# Patient Record
Sex: Female | Born: 1980 | Race: White | Hispanic: No | Marital: Married | Smoking: Former smoker
Health system: Southern US, Community
[De-identification: ages and names within clinical notes are randomized; demographics above are authoritative.]

## PROBLEM LIST (undated history)

## (undated) DIAGNOSIS — F419 Anxiety disorder, unspecified: Secondary | ICD-10-CM

## (undated) DIAGNOSIS — F32A Depression, unspecified: Secondary | ICD-10-CM

## (undated) DIAGNOSIS — O9989 Other specified diseases and conditions complicating pregnancy, childbirth and the puerperium: Secondary | ICD-10-CM

## (undated) DIAGNOSIS — R51 Headache: Secondary | ICD-10-CM

## (undated) DIAGNOSIS — K219 Gastro-esophageal reflux disease without esophagitis: Secondary | ICD-10-CM

## (undated) DIAGNOSIS — T7840XA Allergy, unspecified, initial encounter: Secondary | ICD-10-CM

## (undated) DIAGNOSIS — R519 Headache, unspecified: Secondary | ICD-10-CM

## (undated) DIAGNOSIS — Z8659 Personal history of other mental and behavioral disorders: Secondary | ICD-10-CM

## (undated) DIAGNOSIS — Z8669 Personal history of other diseases of the nervous system and sense organs: Secondary | ICD-10-CM

## (undated) DIAGNOSIS — Z8619 Personal history of other infectious and parasitic diseases: Secondary | ICD-10-CM

## (undated) DIAGNOSIS — R112 Nausea with vomiting, unspecified: Secondary | ICD-10-CM

## (undated) DIAGNOSIS — Z9889 Other specified postprocedural states: Secondary | ICD-10-CM

## (undated) DIAGNOSIS — F329 Major depressive disorder, single episode, unspecified: Secondary | ICD-10-CM

## (undated) DIAGNOSIS — O99891 Other specified diseases and conditions complicating pregnancy: Secondary | ICD-10-CM

## (undated) DIAGNOSIS — D649 Anemia, unspecified: Secondary | ICD-10-CM

## (undated) HISTORY — DX: Personal history of other infectious and parasitic diseases: Z86.19

## (undated) HISTORY — DX: Personal history of other diseases of the nervous system and sense organs: Z86.69

## (undated) HISTORY — DX: Other specified diseases and conditions complicating pregnancy: O99.891

## (undated) HISTORY — DX: Other specified diseases and conditions complicating pregnancy, childbirth and the puerperium: O99.89

## (undated) HISTORY — DX: Personal history of other mental and behavioral disorders: Z86.59

## (undated) HISTORY — DX: Allergy, unspecified, initial encounter: T78.40XA

## (undated) HISTORY — DX: Anemia, unspecified: D64.9

## (undated) HISTORY — PX: WISDOM TOOTH EXTRACTION: SHX21

## (undated) HISTORY — DX: Gastro-esophageal reflux disease without esophagitis: K21.9

---
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
## 2006-10-20 ENCOUNTER — Emergency Department (HOSPITAL_COMMUNITY): Admission: EM | Admit: 2006-10-20 | Discharge: 2006-10-20 | Payer: Self-pay | Admitting: Emergency Medicine

## 2007-03-19 ENCOUNTER — Emergency Department (HOSPITAL_COMMUNITY): Admission: EM | Admit: 2007-03-19 | Discharge: 2007-03-19 | Payer: Self-pay | Admitting: Emergency Medicine

## 2008-09-19 IMAGING — CT CT HEAD W/O CM
1 of 2 series · 13 of 30 positions shown, 17 images · non-contrast
Comparison: None.

CLINICAL DATA: Migraine headache.

HEAD CT WITHOUT CONTRAST
TECHNIQUE: 5mm collimated images were obtained from the base of the skull
through the vertex, according to standard protocol, without contrast.

[Series 2: brain · axial · 0.47mm/px · z∈[+149,+269]mm · 13 of 28 slices shown, 17 images]
[im 2/28  brain]
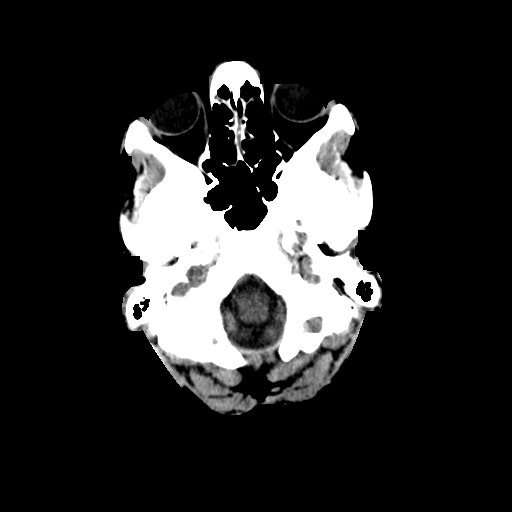
[im 2/28  bone]
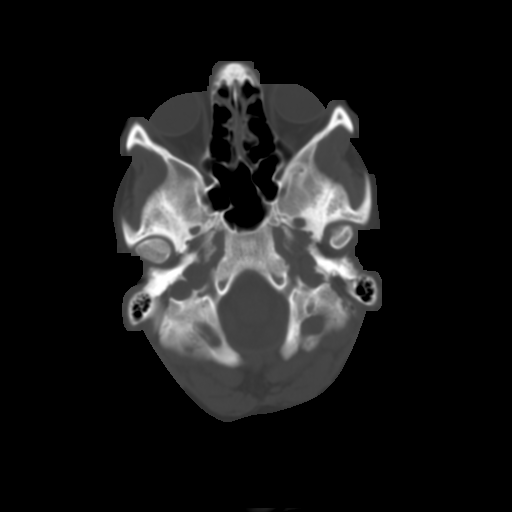
[im 4/28  brain]
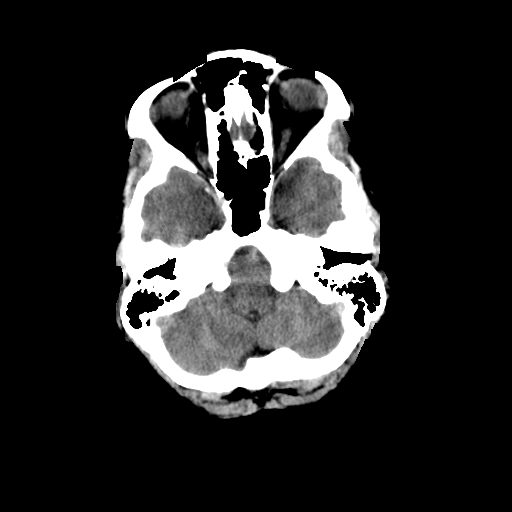
[im 6/28  brain]
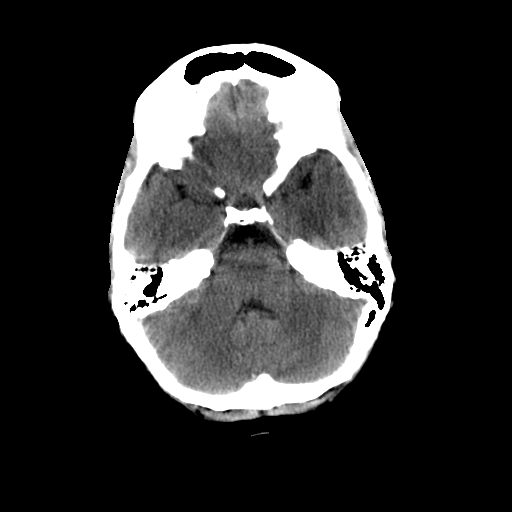
[im 8/28  brain]
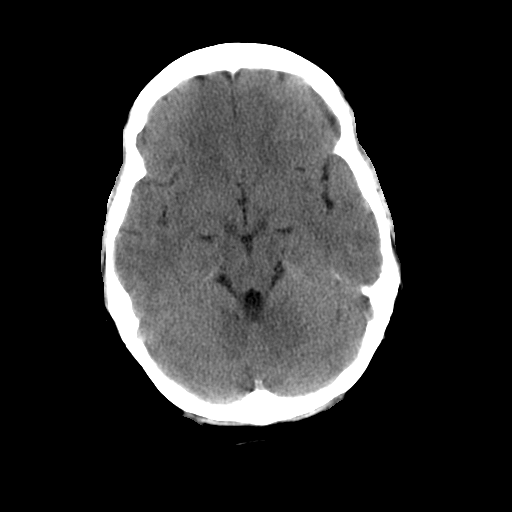
[im 10/28  brain]
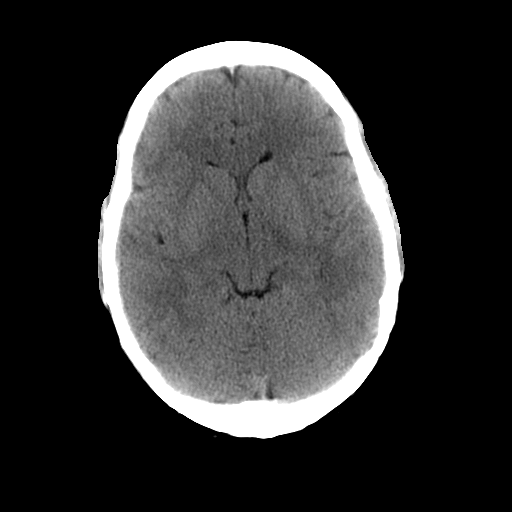
[im 10/28  bone]
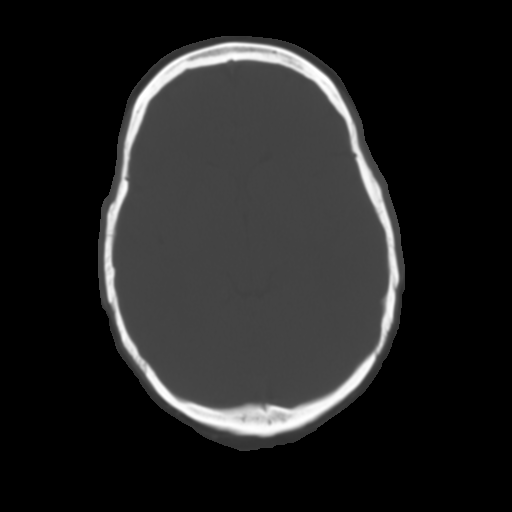
[im 12/28  brain]
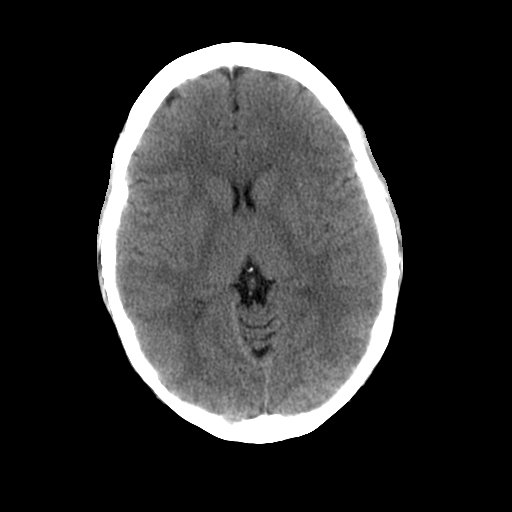
[im 14/28  brain]
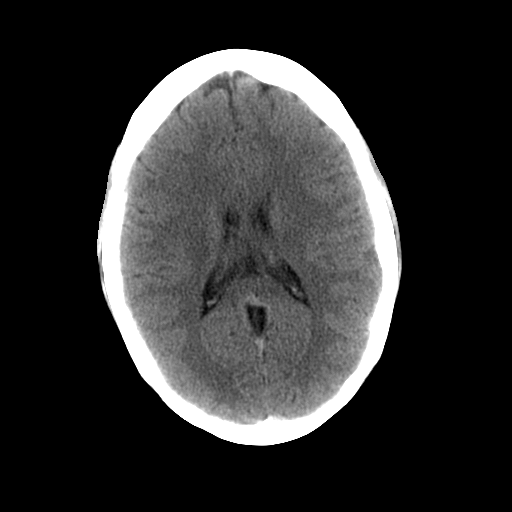
[im 16/28  brain]
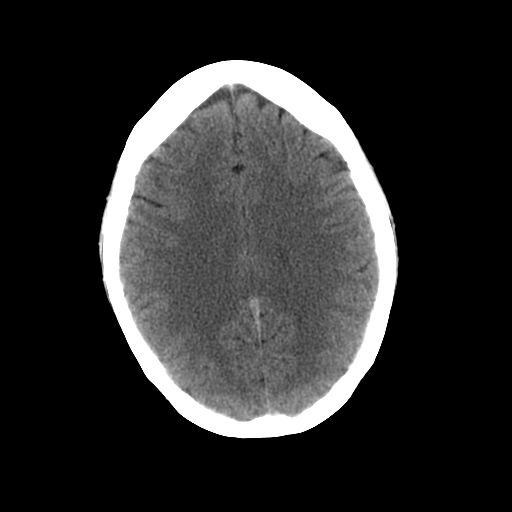
[im 18/28  brain]
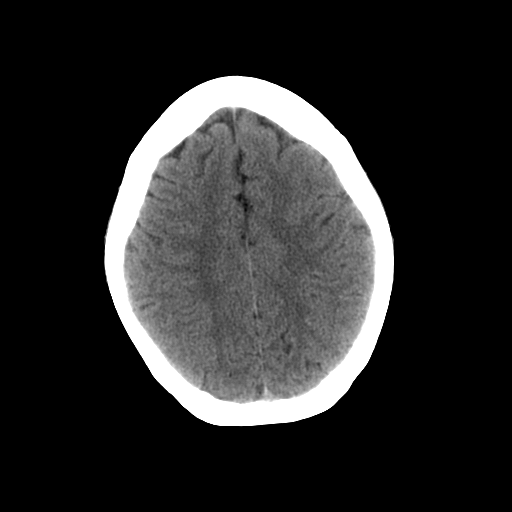
[im 18/28  bone]
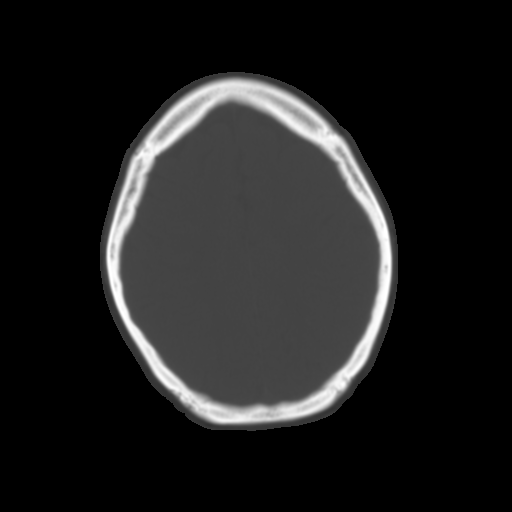
[im 20/28  brain]
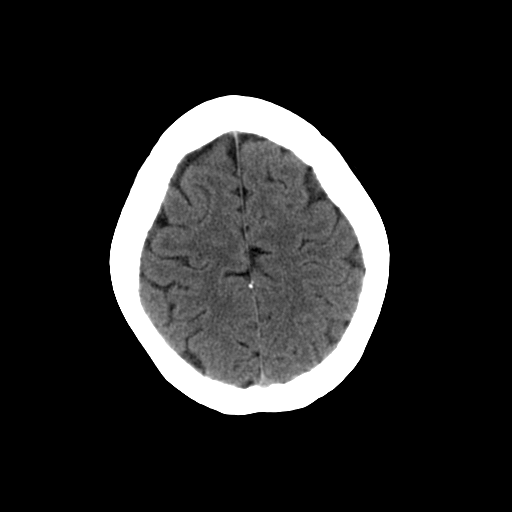
[im 22/28  brain]
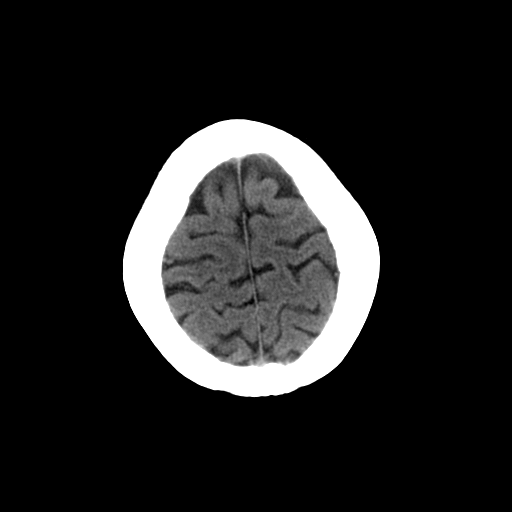
[im 24/28  brain]
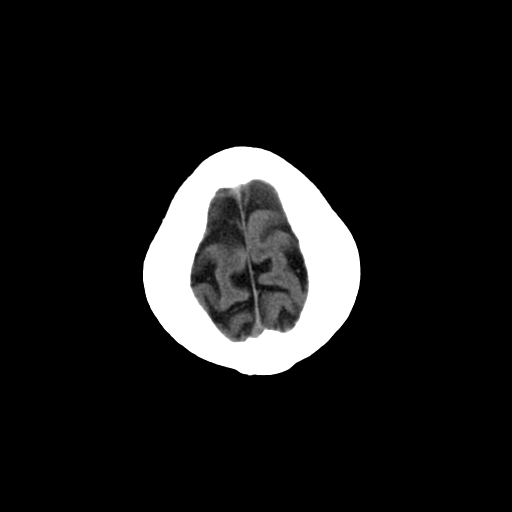
[im 26/28  brain]
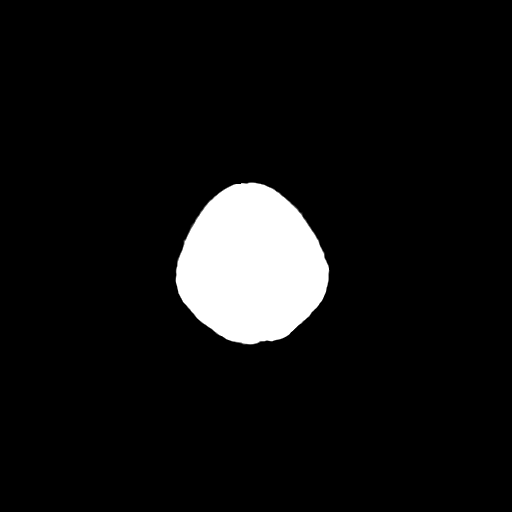
[im 26/28  bone]
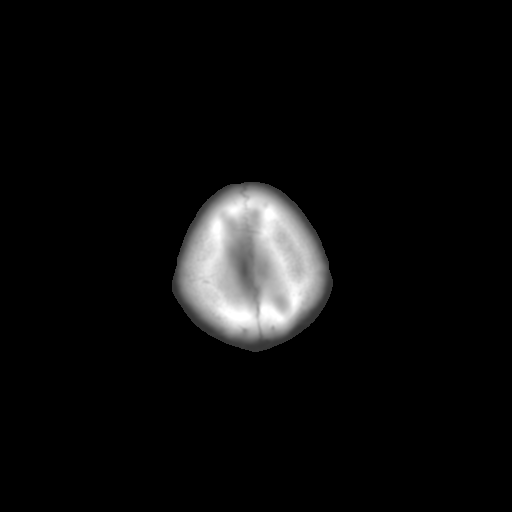

[13 of 30 positions shown; findings below may reference images not displayed]

FINDINGS: Normal appearing cerebral hemispheres and posterior fossa structures.
Normal size and position of the ventricles.  No intracranial hemorrhage, mass,
mass effect or areas of acute infarction identified.  Unremarkable bones and
included portions of the paranasal sinuses.

IMPRESSION

Normal examination.

## 2011-11-03 ENCOUNTER — Encounter (INDEPENDENT_AMBULATORY_CARE_PROVIDER_SITE_OTHER): Payer: Medicaid Other | Admitting: Obstetrics and Gynecology

## 2011-11-03 ENCOUNTER — Other Ambulatory Visit: Payer: Medicaid Other

## 2011-11-03 DIAGNOSIS — Z331 Pregnant state, incidental: Secondary | ICD-10-CM

## 2011-11-17 ENCOUNTER — Encounter: Payer: Medicaid Other | Admitting: Obstetrics and Gynecology

## 2011-11-18 ENCOUNTER — Encounter: Payer: Medicaid Other | Admitting: Obstetrics and Gynecology

## 2011-11-18 ENCOUNTER — Encounter (INDEPENDENT_AMBULATORY_CARE_PROVIDER_SITE_OTHER): Payer: Medicaid Other

## 2011-11-18 DIAGNOSIS — Z3480 Encounter for supervision of other normal pregnancy, unspecified trimester: Secondary | ICD-10-CM

## 2011-11-18 DIAGNOSIS — O358XX0 Maternal care for other (suspected) fetal abnormality and damage, not applicable or unspecified: Secondary | ICD-10-CM

## 2011-12-02 ENCOUNTER — Encounter: Payer: Self-pay | Admitting: Obstetrics and Gynecology

## 2011-12-02 ENCOUNTER — Ambulatory Visit (INDEPENDENT_AMBULATORY_CARE_PROVIDER_SITE_OTHER): Payer: Medicaid Other | Admitting: Obstetrics and Gynecology

## 2011-12-02 VITALS — BP 102/66 | Wt 174.0 lb

## 2011-12-02 DIAGNOSIS — Z8659 Personal history of other mental and behavioral disorders: Secondary | ICD-10-CM

## 2011-12-02 DIAGNOSIS — Z9889 Other specified postprocedural states: Secondary | ICD-10-CM

## 2011-12-02 DIAGNOSIS — F489 Nonpsychotic mental disorder, unspecified: Secondary | ICD-10-CM

## 2011-12-02 DIAGNOSIS — F329 Major depressive disorder, single episode, unspecified: Secondary | ICD-10-CM

## 2011-12-02 DIAGNOSIS — R51 Headache: Secondary | ICD-10-CM

## 2011-12-02 DIAGNOSIS — O9989 Other specified diseases and conditions complicating pregnancy, childbirth and the puerperium: Secondary | ICD-10-CM

## 2011-12-02 DIAGNOSIS — O99340 Other mental disorders complicating pregnancy, unspecified trimester: Secondary | ICD-10-CM

## 2011-12-02 DIAGNOSIS — O26899 Other specified pregnancy related conditions, unspecified trimester: Secondary | ICD-10-CM

## 2011-12-02 DIAGNOSIS — O09299 Supervision of pregnancy with other poor reproductive or obstetric history, unspecified trimester: Secondary | ICD-10-CM

## 2011-12-02 DIAGNOSIS — G43009 Migraine without aura, not intractable, without status migrainosus: Secondary | ICD-10-CM

## 2011-12-02 DIAGNOSIS — Z98891 History of uterine scar from previous surgery: Secondary | ICD-10-CM

## 2011-12-02 MED ORDER — CYCLOBENZAPRINE HCL 5 MG PO TABS: ORAL_TABLET | ORAL | Status: DC

## 2011-12-02 MED ORDER — SERTRALINE HCL 50 MG PO TABS: 50.0000 mg | ORAL_TABLET | Freq: Every day | ORAL | Status: DC

## 2011-12-02 NOTE — Progress Notes (Signed)
C/O HA'S AND VISUAL CHANGES X 1 MONTH. NO DIZZINESS. H/O MIGRAINES. DISCUSS ISSUES AT HOME

## 2011-12-02 NOTE — Patient Instructions (Addendum)
Depression  Depression is a strong emotion of feeling unhappy that can last for weeks, months, or even longer. Depression causes problems with the ability to function in life. It upsets your:   Relationships.   Sleep.   Eating habits.   Work habits.  HOME CARE  Take all medicine as told by your doctor.   Talk with a therapist, counselor, or friend.   Eat a healthy diet.   Exercise regularly.   Do not drink alcohol or use drugs.  GET HELP RIGHT AWAY IF: You start to have thoughts about hurting yourself or others. MAKE SURE YOU:  Understand these instructions.   Will watch your condition.   Will get help right away if you are not doing well or get worse.  Document Released: 09/13/2010 Document Revised: 07/31/2011 Document Reviewed: 09/13/2010 [facility] Patient Information 2012 [place], [place].Headache and Allergies The relationship between allergies and headaches is unclear. Many people with allergic or infectious nasal problems also have headaches (migraines or sinus headaches). However, sometimes allergies can cause pressure that feels like a headache, and sometimes headaches can cause allergy-like symptoms. It is not always clear whether your symptoms are caused by allergies or by a headache. CAUSES   Migraine: The cause of a migraine is not always known.   Sinus Headache: The cause of a sinus headache may be a sinus infection. Other conditions that may be related to sinus headaches include:   Hay fever (allergic rhinitis).   Deviation of the nasal septum.   Swelling or clogging of the nasal passages.  SYMPTOMS  Migraine headache symptoms (which often last 4 to 72 hours) include:  Intense, throbbing pain on one or both sides of the head.   Nausea.   Vomiting.   Being extra sensitive to light.   Being extra sensitive to sound.   Nervous system reactions that appear similar to an allergic reaction:   Stuffy nose.   Runny nose.   Tearing.  Sinus headaches  are felt as facial pain or pressure.  DIAGNOSIS  Because there is some overlap in symptoms, sinus and migraine headaches are often misdiagnosed. For example, a person with migraines may also feel facial pressure. Likewise, many people with hay fever may get migraine headaches rather than sinus headaches. These migraines can be triggered by the histamine release during an allergic reaction. An antihistamine medicine can eliminate this pain. There are standard criteria that help clarify the difference between these headaches and related allergy or allergy-like symptoms. Your caregiver can use these criteria to determine the proper diagnosis and provide you the best care. TREATMENT  Migraine medicine may help people who have persistent migraine headaches even though their hay fever is controlled. For some people, anti-inflammatory treatments do not work to relieve migraines. Medicines called triptans (such as sumatriptan) can be helpful for those people. Document Released: 11/01/2003 Document Revised: 07/31/2011 Document Reviewed: 11/23/2009 [facility] Patient Information 2012 [place], [place].

## 2011-12-03 DIAGNOSIS — G43009 Migraine without aura, not intractable, without status migrainosus: Secondary | ICD-10-CM | POA: Insufficient documentation

## 2011-12-03 DIAGNOSIS — Z8659 Personal history of other mental and behavioral disorders: Secondary | ICD-10-CM | POA: Insufficient documentation

## 2011-12-03 NOTE — Progress Notes (Signed)
Subjective:    Miranda Hall is a 31 y.o. female being seen today for her obstetrical visit. She is at [redacted]w[redacted]d gestation. Patient reports headache and stressed mood and hx post partum depression. Fetal movement: normal.  Review of Systems:   Review of Systems  Constitutional: Positive for activity change.  HENT: Positive for neck stiffness. Negative for ear pain and neck pain.   Eyes: Positive for photophobia. Negative for pain, discharge, redness and itching.       Photophobia with headaches which right now occur daily  Respiratory: Negative.   Cardiovascular: Negative.   Gastrointestinal: Negative.   Neurological: Positive for numbness and headaches. Negative for dizziness, tremors, seizures, syncope, facial asymmetry, speech difficulty, weakness and light-headedness.  Hematological: Negative.   Psychiatric/Behavioral: Positive for dysphoric mood and decreased concentration. Negative for hallucinations, behavioral problems, confusion and agitation.       Specifically denied suicidal or homicidal ideation     Objective:    BP 102/66  Wt 174 lb (78.926 kg) Normal 3 hr GTT  Physical Exam  Constitutional: She is oriented to person, place, and time. She appears well-developed and well-nourished.  HENT:  Head: Normocephalic and atraumatic.  Neck: Normal range of motion. Neck supple.  Cardiovascular: Normal rate.   Respiratory: Effort normal.  GI: Soft.  Musculoskeletal: Normal range of motion.  Neurological: She is alert and oriented to person, place, and time.  Skin: Skin is warm and dry.  Psychiatric:       Saddened affect    Exam  FHT:  150's BPM  Uterine Size: 28 cm  Presentation: unsure     Assessment:    Pregnancy:  G2P1001 Depression Headaches:  Tension vs migraines Pt states she feels safe in her household    Plan:    Patient Active Problem List  Diagnoses  . H/O: cesarean section  . H/O postpartum depression, currently pregnant  . Migraine  headache without aura    Agrees to start Zoloft and to go to couselor Follow up in 2 Weeks.

## 2011-12-03 NOTE — Progress Notes (Signed)
Relates significant stressors at home.  Hx PPdepression last delivery.  Wants medication and counseling therapy

## 2011-12-17 ENCOUNTER — Encounter: Payer: Self-pay | Admitting: Obstetrics and Gynecology

## 2011-12-17 ENCOUNTER — Ambulatory Visit (INDEPENDENT_AMBULATORY_CARE_PROVIDER_SITE_OTHER): Payer: Medicaid Other | Admitting: Obstetrics and Gynecology

## 2011-12-17 VITALS — BP 100/64 | Ht 62.0 in | Wt 178.0 lb

## 2011-12-17 DIAGNOSIS — Z331 Pregnant state, incidental: Secondary | ICD-10-CM

## 2011-12-17 NOTE — Patient Instructions (Signed)
Preterm Labor Preterm labor is when labor starts at less than 37 weeks of pregnancy. The normal length of a pregnancy is 39 to 41 weeks. CAUSES Often, there is no identifiable underlying cause as to why a woman goes into preterm labor. However, one of the most common known causes of preterm labor is infection. Infections of the uterus, cervix, vagina, amniotic sac, bladder, kidney, or even the lungs (pneumonia) can cause labor to start. Other causes of preterm labor include:  Urogenital infections, such as yeast infections and bacterial vaginosis.   Uterine abnormalities (uterine shape, uterine septum, fibroids, bleeding from the placenta).   A cervix that has been operated on and opens prematurely.   Malformations in the baby.   Multiple gestations (twins, triplets, and so on).   Breakage of the amniotic sac.  Additional risk factors for preterm labor include:  Previous history of preterm labor.   Premature rupture of membranes (PROM).   A placenta that covers the opening of the cervix (placenta previa).   A placenta that separates from the uterus (placenta abruption).   A cervix that is too weak to hold the baby in the uterus (incompetence cervix).   Having too much fluid in the amniotic sac (polyhydramnios).   Taking illegal drugs or smoking while pregnant.   Not gaining enough weight while pregnant.   Women younger than 18 and older than 29 years old.   Low socioeconomic status.   African-American ethnicity.  SYMPTOMS Signs and symptoms of preterm labor include:  Menstrual-like cramps.   Contractions that are 30 to 70 seconds apart, become very regular, closer together, and are more intense and painful.   Contractions that start on the top of the uterus and spread down to the lower abdomen and back.   A sense of increased pelvic pressure or back pain.   A watery or bloody discharge that comes from the vagina.  DIAGNOSIS  A diagnosis can be confirmed by:  A  vaginal exam.   An ultrasound of the cervix.   Sampling (swabbing) cervico-vaginal secretions. These samples can be tested for the presence of fetal fibronectin. This is a protein found in cervical discharge which is associated with preterm labor.   Fetal monitoring.  TREATMENT  Depending on the length of the pregnancy and other circumstances, a caregiver may suggest bed rest. If necessary, there are medicines that can be given to stop contractions and to quicken fetal lung maturity. If labor happens before 34 weeks of pregnancy, a prolonged hospital stay may be recommended. Treatment depends on the condition of both the mother and baby. PREVENTION There are some things a mother can do to lower the risk of preterm labor in future pregnancies. A woman can:   Stop smoking.   Maintain healthy weight gain and avoid chemicals and drugs that are not necessary.   Be watchful for any type of infection.   Inform her caregiver if she has a known history of preterm labor.  Document Released: 11/01/2003 Document Revised: 07/31/2011 Document Reviewed: 12/06/2010 [employer] Patient Information 2012 [employer].Fetal Movement Counts Patient Name: __________________________________________________ Patient Due Date: ____________________ Kick counts is highly recommended in high risk pregnancies, but it is a good idea for every pregnant woman to do. Start counting fetal movements at 28 weeks of the pregnancy. Fetal movements increase after eating a full meal or eating or drinking something sweet (the blood sugar is higher). It is also important to drink plenty of fluids (well hydrated) before doing the count. Lie   on your left side because it helps with the circulation or you can sit in a comfortable chair with your arms over your belly (abdomen) with no distractions around you. DOING THE COUNT  Try to do the count the same time of day each time you do it.   Mark the day and time, then see how long it  takes for you to feel 10 movements (kicks, flutters, swishes, rolls). You should have at least 10 movements within 2 hours. You will most likely feel 10 movements in much less than 2 hours. If you do not, wait an hour and count again. After a couple of days you will see a pattern.   What you are looking for is a change in the pattern or not enough counts in 2 hours. Is it taking longer in time to reach 10 movements?  SEEK MEDICAL CARE IF:  You feel less than 10 counts in 2 hours. Tried twice.   No movement in one hour.   The pattern is changing or taking longer each day to reach 10 counts in 2 hours.   You feel the baby is not moving as it usually does.  Date: ____________ Movements: ____________ Start time: ____________ Finish time: ____________  Date: ____________ Movements: ____________ Start time: ____________ Finish time: ____________ Date: ____________ Movements: ____________ Start time: ____________ Finish time: ____________ Date: ____________ Movements: ____________ Start time: ____________ Finish time: ____________ Date: ____________ Movements: ____________ Start time: ____________ Finish time: ____________ Date: ____________ Movements: ____________ Start time: ____________ Finish time: ____________ Date: ____________ Movements: ____________ Start time: ____________ Finish time: ____________ Date: ____________ Movements: ____________ Start time: ____________ Finish time: ____________  Date: ____________ Movements: ____________ Start time: ____________ Finish time: ____________ Date: ____________ Movements: ____________ Start time: ____________ Finish time: ____________ Date: ____________ Movements: ____________ Start time: ____________ Finish time: ____________ Date: ____________ Movements: ____________ Start time: ____________ Finish time: ____________ Date: ____________ Movements: ____________ Start time: ____________ Finish time: ____________ Date: ____________ Movements:  ____________ Start time: ____________ Finish time: ____________ Date: ____________ Movements: ____________ Start time: ____________ Finish time: ____________  Date: ____________ Movements: ____________ Start time: ____________ Finish time: ____________ Date: ____________ Movements: ____________ Start time: ____________ Finish time: ____________ Date: ____________ Movements: ____________ Start time: ____________ Finish time: ____________ Date: ____________ Movements: ____________ Start time: ____________ Finish time: ____________ Date: ____________ Movements: ____________ Start time: ____________ Finish time: ____________ Date: ____________ Movements: ____________ Start time: ____________ Finish time: ____________ Date: ____________ Movements: ____________ Start time: ____________ Finish time: ____________  Date: ____________ Movements: ____________ Start time: ____________ Finish time: ____________ Date: ____________ Movements: ____________ Start time: ____________ Finish time: ____________ Date: ____________ Movements: ____________ Start time: ____________ Finish time: ____________ Date: ____________ Movements: ____________ Start time: ____________ Finish time: ____________ Date: ____________ Movements: ____________ Start time: ____________ Finish time: ____________ Date: ____________ Movements: ____________ Start time: ____________ Finish time: ____________ Date: ____________ Movements: ____________ Start time: ____________ Finish time: ____________  Date: ____________ Movements: ____________ Start time: ____________ Finish time: ____________ Date: ____________ Movements: ____________ Start time: ____________ Finish time: ____________ Date: ____________ Movements: ____________ Start time: ____________ Finish time: ____________ Date: ____________ Movements: ____________ Start time: ____________ Finish time: ____________ Date: ____________ Movements: ____________ Start time: ____________ Finish  time: ____________ Date: ____________ Movements: ____________ Start time: ____________ Finish time: ____________ Date: ____________ Movements: ____________ Start time: ____________ Finish time: ____________  Date: ____________ Movements: ____________ Start time: ____________ Finish time: ____________ Date: ____________ Movements: ____________ Start time: ____________ Finish time: ____________ Date: ____________ Movements: ____________ Start time: ____________ Finish time: ____________   Date: ____________ Movements: ____________ Start time: ____________ Finish time: ____________ Date: ____________ Movements: ____________ Start time: ____________ Finish time: ____________ Date: ____________ Movements: ____________ Start time: ____________ Finish time: ____________ Date: ____________ Movements: ____________ Start time: ____________ Finish time: ____________  Date: ____________ Movements: ____________ Start time: ____________ Finish time: ____________ Date: ____________ Movements: ____________ Start time: ____________ Finish time: ____________ Date: ____________ Movements: ____________ Start time: ____________ Finish time: ____________ Date: ____________ Movements: ____________ Start time: ____________ Finish time: ____________ Date: ____________ Movements: ____________ Start time: ____________ Finish time: ____________ Date: ____________ Movements: ____________ Start time: ____________ Finish time: ____________ Date: ____________ Movements: ____________ Start time: ____________ Finish time: ____________  Date: ____________ Movements: ____________ Start time: ____________ Finish time: ____________ Date: ____________ Movements: ____________ Start time: ____________ Finish time: ____________ Date: ____________ Movements: ____________ Start time: ____________ Finish time: ____________ Date: ____________ Movements: ____________ Start time: ____________ Finish time: ____________ Date: ____________  Movements: ____________ Start time: ____________ Finish time: ____________ Date: ____________ Movements: ____________ Start time: ____________ Finish time: ____________ Document Released: 09/10/2006 Document Revised: 07/31/2011 Document Reviewed: 03/13/2009 [employer] Patient Information 2012 [employer]. 

## 2011-12-17 NOTE — Progress Notes (Signed)
-   Desires VBAC

## 2011-12-24 ENCOUNTER — Inpatient Hospital Stay (HOSPITAL_COMMUNITY): Admission: AD | Admit: 2011-12-24 | Payer: Self-pay | Source: Ambulatory Visit | Admitting: Obstetrics and Gynecology

## 2011-12-27 ENCOUNTER — Telehealth: Payer: Self-pay | Admitting: Obstetrics and Gynecology

## 2011-12-27 NOTE — Telephone Encounter (Signed)
G2P1 at 35 weeks calling with pain to right of umbilicus and down into groin/leg crease, particularly with movement/position change. Positive FM, no contractions, leaking or bleeding.  No fever, HA, etc. No prenatal complications.  Probable round ligament pain. Comfort measures reviewed. Tylenol, heat, rest. Call if sx worsen or any other issues arise.

## 2011-12-30 ENCOUNTER — Other Ambulatory Visit: Payer: Self-pay | Admitting: Obstetrics and Gynecology

## 2011-12-30 DIAGNOSIS — O358XX0 Maternal care for other (suspected) fetal abnormality and damage, not applicable or unspecified: Secondary | ICD-10-CM

## 2011-12-30 NOTE — Progress Notes (Signed)
C/o L foot pain, tender to top of foot, no obvious swelling or bruising, declines any injury to her foot, states she is on her feet a lot, wearing flip/flops today Enc sneakers for better foot support, ice and keep elevated when can, if does not improve will refer to ortho Feels better on zoloft, hasn't been to counseling, enc pt to sched counsel  rv'd [facility] PTL sx's RTO 2wks

## 2011-12-31 ENCOUNTER — Ambulatory Visit (INDEPENDENT_AMBULATORY_CARE_PROVIDER_SITE_OTHER): Payer: Medicaid Other

## 2011-12-31 VITALS — BP 120/68 | Wt 183.0 lb

## 2011-12-31 DIAGNOSIS — F489 Nonpsychotic mental disorder, unspecified: Secondary | ICD-10-CM

## 2011-12-31 DIAGNOSIS — O321XX0 Maternal care for breech presentation, not applicable or unspecified: Secondary | ICD-10-CM | POA: Insufficient documentation

## 2011-12-31 DIAGNOSIS — R7309 Other abnormal glucose: Secondary | ICD-10-CM

## 2011-12-31 DIAGNOSIS — O99340 Other mental disorders complicating pregnancy, unspecified trimester: Secondary | ICD-10-CM

## 2011-12-31 DIAGNOSIS — F329 Major depressive disorder, single episode, unspecified: Secondary | ICD-10-CM

## 2011-12-31 DIAGNOSIS — O358XX0 Maternal care for other (suspected) fetal abnormality and damage, not applicable or unspecified: Secondary | ICD-10-CM

## 2011-12-31 LAB — US OB LIMITED

## 2011-12-31 NOTE — Progress Notes (Signed)
U/s today for presentation and frank breech; AFI= 22(85%).  Post plac Grade 1.  Pt still desires VBAC.  Rev'd options for malpresentation: 1.  expectant management (assess w/ onset of labor or scheduling c/s at 39 weeks), 2.  ext version, and/or 3. moxibustion or chiropractor.  Pt's husband not at visit.  Will discuss options further w/ him and f/u prn any questions before next appt.  Pt leaning towards version, but will discuss further w/ MD NV.  Reports mood is improved on Zoloft.  Freq HA's recently and Flexeril (5mg ) caused severe drowsiness.  Questioning other meds; offered Tylenol #3 or stronger Narcotic, and pt declined; Rec'd 2 ES Tylenol at onset w/ some caffeine and other comfort measures. Will CTO.   GFM.  No PTL s/s. GBS NV.

## 2012-01-01 ENCOUNTER — Telehealth: Payer: Self-pay | Admitting: Obstetrics and Gynecology

## 2012-01-01 NOTE — Telephone Encounter (Signed)
Triage/elect. 

## 2012-01-02 NOTE — Telephone Encounter (Signed)
Tc from pt. Pt has decided to move forth with version for breech baby. Appt sched with [facility] 01-05-12 and will discuss further. Pt voices understanding.

## 2012-01-02 NOTE — Telephone Encounter (Signed)
Lm on vm to cb per telephone call.  

## 2012-01-05 ENCOUNTER — Telehealth: Payer: Self-pay | Admitting: Obstetrics and Gynecology

## 2012-01-05 ENCOUNTER — Other Ambulatory Visit: Payer: Self-pay | Admitting: Obstetrics and Gynecology

## 2012-01-05 ENCOUNTER — Ambulatory Visit (INDEPENDENT_AMBULATORY_CARE_PROVIDER_SITE_OTHER): Payer: Medicaid Other | Admitting: Obstetrics and Gynecology

## 2012-01-05 ENCOUNTER — Encounter: Payer: Self-pay | Admitting: Obstetrics and Gynecology

## 2012-01-05 VITALS — BP 122/70 | Ht 62.0 in | Wt 182.0 lb

## 2012-01-05 DIAGNOSIS — Z331 Pregnant state, incidental: Secondary | ICD-10-CM

## 2012-01-05 DIAGNOSIS — O09299 Supervision of pregnancy with other poor reproductive or obstetric history, unspecified trimester: Secondary | ICD-10-CM

## 2012-01-05 DIAGNOSIS — F3289 Other specified depressive episodes: Secondary | ICD-10-CM

## 2012-01-05 DIAGNOSIS — Z9889 Other specified postprocedural states: Secondary | ICD-10-CM

## 2012-01-05 DIAGNOSIS — G43009 Migraine without aura, not intractable, without status migrainosus: Secondary | ICD-10-CM

## 2012-01-05 DIAGNOSIS — Z98891 History of uterine scar from previous surgery: Secondary | ICD-10-CM

## 2012-01-05 DIAGNOSIS — O99340 Other mental disorders complicating pregnancy, unspecified trimester: Secondary | ICD-10-CM

## 2012-01-05 DIAGNOSIS — Z8659 Personal history of other mental and behavioral disorders: Secondary | ICD-10-CM

## 2012-01-05 DIAGNOSIS — F329 Major depressive disorder, single episode, unspecified: Secondary | ICD-10-CM

## 2012-01-05 DIAGNOSIS — F489 Nonpsychotic mental disorder, unspecified: Secondary | ICD-10-CM

## 2012-01-05 NOTE — Progress Notes (Signed)
VBAC calculator=55% chance of successful TOLAC.   Pt therefore wants repeat c/s.   Wants it done on 01/29/12 (102w4d). GBS done

## 2012-01-06 ENCOUNTER — Other Ambulatory Visit: Payer: Self-pay | Admitting: Obstetrics and Gynecology

## 2012-01-07 NOTE — Progress Notes (Signed)
Addended by: Jaymes Graff on: 01/07/2012 03:08 PM   Modules accepted: Orders

## 2012-01-08 LAB — CULTURE, BETA STREP (GROUP B ONLY)

## 2012-01-12 ENCOUNTER — Telehealth: Payer: Self-pay | Admitting: Obstetrics and Gynecology

## 2012-01-12 NOTE — Telephone Encounter (Signed)
Repeat C/S scheduled for 01/29/12 @ 9:30 with ND Adrianne Pridgen

## 2012-01-13 ENCOUNTER — Inpatient Hospital Stay (HOSPITAL_COMMUNITY)
Admission: RE | Admit: 2012-01-13 | Discharge: 2012-01-15 | DRG: 766 | Disposition: A | Discharge status: Home / Self Care | Payer: Medicaid Other | Source: Ambulatory Visit | Attending: Obstetrics and Gynecology | Admitting: Obstetrics and Gynecology

## 2012-01-13 ENCOUNTER — Inpatient Hospital Stay (HOSPITAL_COMMUNITY): Payer: Medicaid Other | Admitting: Anesthesiology

## 2012-01-13 ENCOUNTER — Other Ambulatory Visit: Payer: Self-pay | Admitting: Obstetrics and Gynecology

## 2012-01-13 ENCOUNTER — Ambulatory Visit (INDEPENDENT_AMBULATORY_CARE_PROVIDER_SITE_OTHER): Payer: Medicaid Other

## 2012-01-13 ENCOUNTER — Encounter (HOSPITAL_COMMUNITY): Payer: Self-pay | Admitting: *Deleted

## 2012-01-13 ENCOUNTER — Encounter (HOSPITAL_COMMUNITY): Payer: Self-pay | Admitting: Anesthesiology

## 2012-01-13 ENCOUNTER — Ambulatory Visit (INDEPENDENT_AMBULATORY_CARE_PROVIDER_SITE_OTHER): Payer: Medicaid Other | Admitting: Obstetrics and Gynecology

## 2012-01-13 ENCOUNTER — Encounter: Payer: Self-pay | Admitting: Obstetrics and Gynecology

## 2012-01-13 ENCOUNTER — Encounter (HOSPITAL_COMMUNITY)
Admission: RE | Disposition: A | Discharge status: Home / Self Care | Payer: Self-pay | Source: Ambulatory Visit | Attending: Obstetrics and Gynecology

## 2012-01-13 VITALS — BP 120/66 | Wt 180.0 lb

## 2012-01-13 DIAGNOSIS — O364XX0 Maternal care for intrauterine death, not applicable or unspecified: Secondary | ICD-10-CM

## 2012-01-13 DIAGNOSIS — Z98891 History of uterine scar from previous surgery: Secondary | ICD-10-CM | POA: Diagnosis not present

## 2012-01-13 DIAGNOSIS — IMO0002 Reserved for concepts with insufficient information to code with codable children: Secondary | ICD-10-CM | POA: Diagnosis present

## 2012-01-13 DIAGNOSIS — O34219 Maternal care for unspecified type scar from previous cesarean delivery: Secondary | ICD-10-CM | POA: Diagnosis present

## 2012-01-13 LAB — RPR: RPR Ser Ql: NONREACTIVE

## 2012-01-13 LAB — US OB LIMITED

## 2012-01-13 LAB — CBC
HCT: 35.3 % — ABNORMAL LOW (ref 36.0–46.0)
Hemoglobin: 11.6 g/dL — ABNORMAL LOW (ref 12.0–15.0)
MCH: 29.4 pg (ref 26.0–34.0)
MCHC: 32.9 g/dL (ref 30.0–36.0)

## 2012-01-13 LAB — SURGICAL PCR SCREEN: MRSA, PCR: NEGATIVE

## 2012-01-13 SURGERY — Surgical Case
Anesthesia: Spinal | Site: Abdomen | Wound class: Clean Contaminated

## 2012-01-13 MED ORDER — FERROUS SULFATE 325 (65 FE) MG PO TABS
325.0000 mg | ORAL_TABLET | Freq: Two times a day (BID) | ORAL | Status: DC
  Administered 2012-01-15: 325 mg via ORAL
  Filled 2012-01-13: qty 1

## 2012-01-13 MED ORDER — ONDANSETRON HCL 4 MG/2ML IJ SOLN: 4.0000 mg | INTRAMUSCULAR | Status: DC | PRN

## 2012-01-13 MED ORDER — SCOPOLAMINE 1 MG/3DAYS TD PT72
1.0000 | MEDICATED_PATCH | Freq: Once | TRANSDERMAL | Status: DC
  Administered 2012-01-13: 1.5 mg via TRANSDERMAL

## 2012-01-13 MED ORDER — DIPHENHYDRAMINE HCL 50 MG/ML IJ SOLN: 25.0000 mg | INTRAMUSCULAR | Status: DC | PRN

## 2012-01-13 MED ORDER — ZOLPIDEM TARTRATE 5 MG PO TABS
5.0000 mg | ORAL_TABLET | Freq: Every evening | ORAL | Status: DC | PRN
  Administered 2012-01-14: 5 mg via ORAL
  Filled 2012-01-13: qty 1

## 2012-01-13 MED ORDER — BUPIVACAINE IN DEXTROSE 0.75-8.25 % IT SOLN
INTRATHECAL | Status: DC | PRN
Start: 2012-01-13 — End: 2012-01-13
  Administered 2012-01-13: 1.6 mL via INTRATHECAL

## 2012-01-13 MED ORDER — EPHEDRINE SULFATE 50 MG/ML IJ SOLN
INTRAMUSCULAR | Status: DC | PRN
  Administered 2012-01-13: 10 mg via INTRAVENOUS

## 2012-01-13 MED ORDER — MUPIROCIN 2 % EX OINT
TOPICAL_OINTMENT | Freq: Two times a day (BID) | CUTANEOUS | Status: DC
  Administered 2012-01-13: 15:00:00 via NASAL

## 2012-01-13 MED ORDER — PHENYLEPHRINE 40 MCG/ML (10ML) SYRINGE FOR IV PUSH (FOR BLOOD PRESSURE SUPPORT)
PREFILLED_SYRINGE | INTRAVENOUS | Status: AC
  Filled 2012-01-13: qty 15

## 2012-01-13 MED ORDER — MEPERIDINE HCL 25 MG/ML IJ SOLN: 6.2500 mg | INTRAMUSCULAR | Status: DC | PRN

## 2012-01-13 MED ORDER — MENTHOL 3 MG MT LOZG: 1.0000 | LOZENGE | OROMUCOSAL | Status: DC | PRN

## 2012-01-13 MED ORDER — KETOROLAC TROMETHAMINE 60 MG/2ML IM SOLN: 60.0000 mg | Freq: Once | INTRAMUSCULAR | Status: DC | PRN

## 2012-01-13 MED ORDER — ONDANSETRON HCL 4 MG/2ML IJ SOLN
INTRAMUSCULAR | Status: AC
  Filled 2012-01-13: qty 2

## 2012-01-13 MED ORDER — DIPHENHYDRAMINE HCL 25 MG PO CAPS: 25.0000 mg | ORAL_CAPSULE | ORAL | Status: DC | PRN

## 2012-01-13 MED ORDER — SODIUM CHLORIDE 0.9 % IJ SOLN: 3.0000 mL | INTRAMUSCULAR | Status: DC | PRN

## 2012-01-13 MED ORDER — LACTATED RINGERS IV SOLN
INTRAVENOUS | Status: DC
  Administered 2012-01-13 (×3): via INTRAVENOUS

## 2012-01-13 MED ORDER — MEASLES, MUMPS & RUBELLA VAC ~~LOC~~ INJ: 0.5000 mL | INJECTION | Freq: Once | SUBCUTANEOUS | Status: DC

## 2012-01-13 MED ORDER — PHENYLEPHRINE HCL 10 MG/ML IJ SOLN
INTRAMUSCULAR | Status: DC | PRN
  Administered 2012-01-13: 120 ug via INTRAVENOUS
  Administered 2012-01-13: 80 ug via INTRAVENOUS
  Administered 2012-01-13 (×2): 120 ug via INTRAVENOUS

## 2012-01-13 MED ORDER — TETANUS-DIPHTH-ACELL PERTUSSIS 5-2.5-18.5 LF-MCG/0.5 IM SUSP: 0.5000 mL | Freq: Once | INTRAMUSCULAR | Status: DC

## 2012-01-13 MED ORDER — IBUPROFEN 600 MG PO TABS
600.0000 mg | ORAL_TABLET | Freq: Four times a day (QID) | ORAL | Status: DC
  Administered 2012-01-14 – 2012-01-15 (×7): 600 mg via ORAL
  Filled 2012-01-13 (×7): qty 1

## 2012-01-13 MED ORDER — ONDANSETRON HCL 4 MG PO TABS: 4.0000 mg | ORAL_TABLET | ORAL | Status: DC | PRN

## 2012-01-13 MED ORDER — EPINEPHRINE HCL 0.1 MG/ML IJ SOLN
INTRAMUSCULAR | Status: AC
  Filled 2012-01-13: qty 10

## 2012-01-13 MED ORDER — BISACODYL 10 MG RE SUPP: 10.0000 mg | Freq: Every day | RECTAL | Status: DC | PRN

## 2012-01-13 MED ORDER — METOCLOPRAMIDE HCL 5 MG/ML IJ SOLN: 10.0000 mg | Freq: Three times a day (TID) | INTRAMUSCULAR | Status: DC | PRN

## 2012-01-13 MED ORDER — PROMETHAZINE HCL 25 MG/ML IJ SOLN
6.2500 mg | INTRAMUSCULAR | Status: DC | PRN
  Administered 2012-01-13: 12.5 mg via INTRAVENOUS

## 2012-01-13 MED ORDER — CEFAZOLIN SODIUM-DEXTROSE 2-3 GM-% IV SOLR
2.0000 g | INTRAVENOUS | Status: AC
  Administered 2012-01-13: 2 g via INTRAVENOUS
  Filled 2012-01-13: qty 50

## 2012-01-13 MED ORDER — KETOROLAC TROMETHAMINE 30 MG/ML IJ SOLN
15.0000 mg | Freq: Once | INTRAMUSCULAR | Status: AC | PRN
  Administered 2012-01-13: 30 mg via INTRAVENOUS

## 2012-01-13 MED ORDER — PRENATAL MULTIVITAMIN CH
1.0000 | ORAL_TABLET | Freq: Every day | ORAL | Status: DC
  Administered 2012-01-14 – 2012-01-15 (×2): 1 via ORAL
  Filled 2012-01-13 (×2): qty 1

## 2012-01-13 MED ORDER — PROMETHAZINE HCL 25 MG/ML IJ SOLN
INTRAMUSCULAR | Status: AC
  Administered 2012-01-13: 12.5 mg via INTRAVENOUS
  Filled 2012-01-13: qty 1

## 2012-01-13 MED ORDER — LACTATED RINGERS IV SOLN
INTRAVENOUS | Status: DC
Start: 2012-01-13 — End: 2012-01-15
  Administered 2012-01-14: 06:00:00 via INTRAVENOUS

## 2012-01-13 MED ORDER — KETOROLAC TROMETHAMINE 30 MG/ML IJ SOLN: 30.0000 mg | Freq: Four times a day (QID) | INTRAMUSCULAR | Status: AC | PRN

## 2012-01-13 MED ORDER — FENTANYL CITRATE 0.05 MG/ML IJ SOLN
INTRAMUSCULAR | Status: AC
  Filled 2012-01-13: qty 2

## 2012-01-13 MED ORDER — SODIUM CHLORIDE 0.9 % IV SOLN: 1.0000 ug/kg/h | INTRAVENOUS | Status: DC | PRN

## 2012-01-13 MED ORDER — HYDROMORPHONE HCL PF 1 MG/ML IJ SOLN: 0.2500 mg | INTRAMUSCULAR | Status: DC | PRN

## 2012-01-13 MED ORDER — DIPHENHYDRAMINE HCL 50 MG/ML IJ SOLN: 12.5000 mg | INTRAMUSCULAR | Status: DC | PRN

## 2012-01-13 MED ORDER — FLEET ENEMA 7-19 GM/118ML RE ENEM: 1.0000 | ENEMA | Freq: Every day | RECTAL | Status: DC | PRN

## 2012-01-13 MED ORDER — NALBUPHINE HCL 10 MG/ML IJ SOLN
5.0000 mg | INTRAMUSCULAR | Status: DC | PRN
  Filled 2012-01-13: qty 1

## 2012-01-13 MED ORDER — SENNOSIDES-DOCUSATE SODIUM 8.6-50 MG PO TABS
2.0000 | ORAL_TABLET | Freq: Every day | ORAL | Status: DC
  Administered 2012-01-14: 2 via ORAL

## 2012-01-13 MED ORDER — OXYTOCIN 20 UNITS IN LACTATED RINGERS INFUSION - SIMPLE
125.0000 mL/h | INTRAVENOUS | Status: AC
  Administered 2012-01-13: 125 mL/h via INTRAVENOUS
  Filled 2012-01-13: qty 1000

## 2012-01-13 MED ORDER — DIBUCAINE 1 % RE OINT: 1.0000 "application " | TOPICAL_OINTMENT | RECTAL | Status: DC | PRN

## 2012-01-13 MED ORDER — WITCH HAZEL-GLYCERIN EX PADS: 1.0000 "application " | MEDICATED_PAD | CUTANEOUS | Status: DC | PRN

## 2012-01-13 MED ORDER — ONDANSETRON HCL 4 MG/2ML IJ SOLN
INTRAMUSCULAR | Status: DC | PRN
  Administered 2012-01-13: 4 mg via INTRAVENOUS

## 2012-01-13 MED ORDER — OXYTOCIN 10 UNIT/ML IJ SOLN
INTRAMUSCULAR | Status: DC | PRN
  Administered 2012-01-13: 20 [IU] via INTRAMUSCULAR

## 2012-01-13 MED ORDER — GELATIN ADSORBABLE OP FILM
ORAL_FILM | OPHTHALMIC | Status: DC | PRN
  Administered 2012-01-13: 1 via OPHTHALMIC

## 2012-01-13 MED ORDER — KETOROLAC TROMETHAMINE 30 MG/ML IJ SOLN
INTRAMUSCULAR | Status: AC
Start: 2012-01-13 — End: 2012-01-13
  Administered 2012-01-13: 30 mg via INTRAVENOUS
  Filled 2012-01-13: qty 1

## 2012-01-13 MED ORDER — MORPHINE SULFATE (PF) 0.5 MG/ML IJ SOLN
INTRAMUSCULAR | Status: DC | PRN
  Administered 2012-01-13: .1 mg via INTRATHECAL

## 2012-01-13 MED ORDER — MIDAZOLAM HCL 2 MG/2ML IJ SOLN
INTRAMUSCULAR | Status: AC
  Filled 2012-01-13: qty 2

## 2012-01-13 MED ORDER — SIMETHICONE 80 MG PO CHEW
80.0000 mg | CHEWABLE_TABLET | Freq: Three times a day (TID) | ORAL | Status: DC
  Administered 2012-01-14 – 2012-01-15 (×3): 80 mg via ORAL

## 2012-01-13 MED ORDER — MORPHINE SULFATE 0.5 MG/ML IJ SOLN
INTRAMUSCULAR | Status: AC
  Filled 2012-01-13: qty 10

## 2012-01-13 MED ORDER — NALOXONE HCL 0.4 MG/ML IJ SOLN: 0.4000 mg | INTRAMUSCULAR | Status: DC | PRN

## 2012-01-13 MED ORDER — MIDAZOLAM HCL 5 MG/5ML IJ SOLN
INTRAMUSCULAR | Status: DC | PRN
  Administered 2012-01-13: 2 mg via INTRAVENOUS

## 2012-01-13 MED ORDER — LANOLIN HYDROUS EX OINT: 1.0000 "application " | TOPICAL_OINTMENT | CUTANEOUS | Status: DC | PRN

## 2012-01-13 MED ORDER — SCOPOLAMINE 1 MG/3DAYS TD PT72
MEDICATED_PATCH | TRANSDERMAL | Status: AC
  Administered 2012-01-13: 1.5 mg via TRANSDERMAL
  Filled 2012-01-13: qty 1

## 2012-01-13 MED ORDER — ONDANSETRON HCL 4 MG/2ML IJ SOLN: 4.0000 mg | Freq: Three times a day (TID) | INTRAMUSCULAR | Status: DC | PRN

## 2012-01-13 MED ORDER — FENTANYL CITRATE 0.05 MG/ML IJ SOLN
INTRAMUSCULAR | Status: DC | PRN
  Administered 2012-01-13 (×2): 25 ug via INTRAVENOUS
  Administered 2012-01-13: 12.5 ug via INTRAVENOUS
  Administered 2012-01-13: 25 ug via INTRAVENOUS
  Administered 2012-01-13: 12.5 ug via INTRATHECAL

## 2012-01-13 MED ORDER — CEFAZOLIN SODIUM-DEXTROSE 2-3 GM-% IV SOLR: 2.0000 g | INTRAVENOUS | Status: DC

## 2012-01-13 MED ORDER — SERTRALINE HCL 50 MG PO TABS
50.0000 mg | ORAL_TABLET | Freq: Every day | ORAL | Status: DC
  Administered 2012-01-14 – 2012-01-15 (×2): 50 mg via ORAL
  Filled 2012-01-13 (×2): qty 1

## 2012-01-13 MED ORDER — DIPHENHYDRAMINE HCL 25 MG PO CAPS: 25.0000 mg | ORAL_CAPSULE | Freq: Four times a day (QID) | ORAL | Status: DC | PRN

## 2012-01-13 MED ORDER — SIMETHICONE 80 MG PO CHEW: 80.0000 mg | CHEWABLE_TABLET | ORAL | Status: DC | PRN

## 2012-01-13 MED ORDER — MUPIROCIN 2 % EX OINT
TOPICAL_OINTMENT | CUTANEOUS | Status: AC
  Filled 2012-01-13: qty 22

## 2012-01-13 MED ORDER — OXYCODONE-ACETAMINOPHEN 5-325 MG PO TABS
1.0000 | ORAL_TABLET | ORAL | Status: DC | PRN
Start: 2012-01-13 — End: 2012-01-15
  Administered 2012-01-14 (×2): 1 via ORAL
  Administered 2012-01-14: 2 via ORAL
  Administered 2012-01-14 – 2012-01-15 (×3): 1 via ORAL
  Filled 2012-01-13 (×5): qty 1
  Filled 2012-01-13: qty 2

## 2012-01-13 SURGICAL SUPPLY — 39 items
APL SKNCLS STERI-STRIP NONHPOA (GAUZE/BANDAGES/DRESSINGS) ×1
BENZOIN TINCTURE PRP APPL 2/3 (GAUZE/BANDAGES/DRESSINGS) ×2 IMPLANT
CHLORAPREP W/TINT 26ML (MISCELLANEOUS) ×2 IMPLANT
CLOTH BEACON ORANGE TIMEOUT ST (SAFETY) ×2 IMPLANT
CONTAINER PREFILL 10% NBF 15ML (MISCELLANEOUS) IMPLANT
DRAIN JACKSON PRT FLT 10 (DRAIN) IMPLANT
DRESSING TELFA 8X3 (GAUZE/BANDAGES/DRESSINGS) ×1 IMPLANT
DRSG COVADERM 4X10 (GAUZE/BANDAGES/DRESSINGS) ×1 IMPLANT
ELECT REM PT RETURN 9FT ADLT (ELECTROSURGICAL) ×2
ELECTRODE REM PT RTRN 9FT ADLT (ELECTROSURGICAL) ×1 IMPLANT
EVACUATOR SILICONE 100CC (DRAIN) IMPLANT
EXTRACTOR VACUUM M CUP 4 TUBE (SUCTIONS) IMPLANT
GLOVE BIO SURGEON STRL SZ 6.5 (GLOVE) ×2 IMPLANT
GLOVE BIOGEL PI IND STRL 7.0 (GLOVE) ×1 IMPLANT
GLOVE BIOGEL PI INDICATOR 7.0 (GLOVE) ×1
GOWN PREVENTION PLUS LG XLONG (DISPOSABLE) ×6 IMPLANT
KIT ABG SYR 3ML LUER SLIP (SYRINGE) IMPLANT
NDL HYPO 25X5/8 SAFETYGLIDE (NEEDLE) IMPLANT
NEEDLE HYPO 25X5/8 SAFETYGLIDE (NEEDLE) IMPLANT
NS IRRIG 1000ML POUR BTL (IV SOLUTION) ×2 IMPLANT
PACK C SECTION WH (CUSTOM PROCEDURE TRAY) ×2 IMPLANT
PAD ABD 7.5X8 STRL (GAUZE/BANDAGES/DRESSINGS) ×1 IMPLANT
RTRCTR C-SECT PINK 25CM LRG (MISCELLANEOUS) ×1 IMPLANT
SLEEVE SCD COMPRESS KNEE MED (MISCELLANEOUS) IMPLANT
SPONGE GAUZE 4X4 12PLY (GAUZE/BANDAGES/DRESSINGS) ×1 IMPLANT
STAPLER VISISTAT 35W (STAPLE) IMPLANT
STRIP CLOSURE SKIN 1/2X4 (GAUZE/BANDAGES/DRESSINGS) ×2 IMPLANT
SUT CHROMIC 0 CT 1 (SUTURE) ×2 IMPLANT
SUT MNCRL AB 3-0 PS2 27 (SUTURE) IMPLANT
SUT PLAIN 2 0 (SUTURE) ×4
SUT PLAIN 2 0 XLH (SUTURE) ×2 IMPLANT
SUT PLAIN ABS 2-0 CT1 27XMFL (SUTURE) ×2 IMPLANT
SUT SILK 2 0 SH (SUTURE) IMPLANT
SUT VIC AB 0 CTX 36 (SUTURE) ×8
SUT VIC AB 0 CTX36XBRD ANBCTRL (SUTURE) ×4 IMPLANT
TAPE CLOTH SURG 4X10 WHT LF (GAUZE/BANDAGES/DRESSINGS) ×1 IMPLANT
TOWEL OR 17X24 6PK STRL BLUE (TOWEL DISPOSABLE) ×4 IMPLANT
TRAY FOLEY CATH 14FR (SET/KITS/TRAYS/PACK) ×2 IMPLANT
WATER STERILE IRR 1000ML POUR (IV SOLUTION) ×1 IMPLANT

## 2012-01-13 NOTE — Op Note (Signed)
Cesarean Section Procedure Note   Miranda Hall  01/13/2012  Indications: IUFD H/O prior cesarean pt desires repeat   Pre-operative Diagnosis: IUFD. At 37 weeks  Post-operative Diagnosis: Same   Surgeon: Surgeon(s) and Role:    * Michael Litter, MD - Primary   Assistants: none  Anesthesia: spinal   Procedure Details:  The patient was seen in the Holding Room. The risks, benefits, complications, treatment options, and expected outcomes were discussed with the patient. The patient concurred with the proposed plan, giving informed consent. identified as Miranda Hall and the procedure verified as C-Section Delivery. A Time Out was held and the above information confirmed.  After induction of anesthesia, the patient was draped and prepped in the usual sterile manner. A transverse incision was made and carried down through the subcutaneous tissue to the fascia. Fascial incision was made in the midline and extended transversely. The fascia was separated from the underlying rectus muscle superiorly and inferiorly. The peritoneum was identified and entered. Peritoneal incision was extended longitudinally with good visualization of bowel and bladder.The alexis retractor was placed in correct position.   The utero-vesical peritoneal reflection was incised transversely and the bladder flap was bluntly freed from the lower uterine segment. A low transverse uterine incision was made. Delivered from cephalic presentation was a  pound infant, with Apgar scores of 0 at one minute and 0 at five minutes. Cord ph was not sent the umbilical cord was clamped and cut cord blood was obtained for evaluation. The placenta was removed Intact and appeared normal. The uterine outline, tubes and ovaries appeared normal}. The uterine incision was closed with running locked sutures of 0Vicryl. A second layer 0 vicrlyl was used to imbricate the uterine incision    Hemostasis was observed. Lavage was carried out until  clear. The fascia was then reapproximated with running sutures of 0plain gut. The subcuticular closure was performed using 3-53monocryl     Instrument, sponge, and needle counts were correct prior the abdominal closure and were correct at the conclusion of the case.    Findings: female infant in vertex presentation.  Tight nuchal cord times three.  Clear fluid noted.  The skin was already peeling all over the infants body.   Normal appearing abdominal and pelvic anatomy   Estimated Blood Loss: 800cc   Total IV Fluids: ml   Urine Output: 200CC OF clear urine  Specimens: placenta to pathology  Complications: no complications  Disposition: PACU - hemodynamically stable.   Maternal Condition: stable   Baby condition / location:  infant born without signs of life  Attending Attestation: I was present and scrubbed for the entire procedure.   Signed: Surgeon(s): Michael Litter, MD

## 2012-01-13 NOTE — Progress Notes (Signed)
Chaplain came to follow up with Miranda Hall after he death of her child. There was a large group of relatives, and friends gathered, including her pastor. The child was also present in the room. Pictures were being taken of the child with her mother and conversations rounded about the suddenness of the death of the child in the womb. After an hour all visitors departed and I returned to provide care.   Because of the large amount of visitors that have gathered in the room, there has been little time for Miranda Hall to absorb her loss. She has not been spoken to about funeral arrangements and the inevitable separation that will occur when her baby will be taken for burial arrangements.   Father of child was resistant to Chaplain care, and brief discussion with him while he was standing off from the picture taking  highlighted the anger and grief he felt. He stood off from the gathering much of the time and seemed distant to the pictures taking and holding of the child. I wonder if his grief and anger will be a negative for his relationship with Miranda Hall in the future.  When I was asked to return by Miranda Hall, the father dismissed me yet again asking me to allow him a few minutes alone with Miranda Hall before I spoke with her. In the end, despite Miranda Hall willingness and openness to chaplain care, her husband kept finding reasons to delay my visit. At 2138 after several more of these delays and  temporary dismissals, I left a card and encouraged Miranda Hall to have a chaplain paged to assist her. I will remain ready to return during the night.   Strongly recommend that daytime chaplains visit Miranda Hall during daytime hours to assess what support and care chaplains can provide to her, and to provide assurance of the benefit of our care.    Benjie Karvonen. Aleni Andrus, APC, D.Min. Chaplain 9:48 PM   01/13/2012

## 2012-01-13 NOTE — Anesthesia Postprocedure Evaluation (Signed)
Anesthesia Post Note  Patient: Miranda Hall  Procedure(s) Performed: Procedure(s) (LRB): CESAREAN SECTION (N/A)  Anesthesia type: Spinal  Patient location: PACU  Post pain: Pain level controlled  Post assessment: Post-op Vital signs reviewed  Last Vitals:  Filed Vitals:   01/13/12 1719  BP: 122/62  Pulse:   Temp: 36.7 C  Resp:     Post vital signs: Reviewed  Level of consciousness: awake  Complications: No apparent anesthesia complications

## 2012-01-13 NOTE — H&P (Signed)
  Miranda Hall is a 31 y.o. female presenting for repeat cesarean section .  Pt came today to the office for a routine prenatal visit and FHTS were unable to be obtained.  Pt had an [place] which was significant for transverse presentation with no fetal heart tones present.  Pt denies having any contractions,LOF or bleeding Pregnancy complicated by 1. IUFD 2. Abnormal one hour.  Three hour glucola had only one positive value 3. Depression  Pt on zoloft  OB History    Grav Para Term Preterm Abortions TAB SAB Ect Mult Living   2 1 1  0 0 0 0 0 0 1     Past Medical History  Diagnosis Date  . H/O candidiasis   . H/O varicella   . Anemia     during pregnancy  . H/O postpartum depression, currently pregnant   . Hx of migraines    Past Surgical History  Procedure Date  . Cesarean section 2010  . Wisdom tooth extraction     age 78   Family History: family history includes Cancer in her maternal grandfather, maternal grandmother, paternal grandfather, and paternal grandmother; Depression in her sister; Drug abuse in her paternal grandfather and paternal grandmother; Heart disease in her paternal grandfather and paternal grandmother; Hypertension in her paternal grandfather and paternal grandmother; Kidney disease in her maternal aunt; and Stroke in her paternal grandfather and paternal grandmother. Social History:  reports that she has never smoked. She has never used smokeless tobacco. She reports that she does not drink alcohol or use illicit drugs.  Review of Systems - Psychological ROS: positive for - depression Neurological ROS: positive for - visual changes and migraines IUFD    There were no vitals filed for this visit. AVSS Physical Examination: General appearance - alert, well appearing, and in no distress Mental status - alert, oriented to person, place, and time, pt crying because of loss Chest - clear to auscultation, no wheezes, rales or rhonchi, symmetric air entry Heart -  normal rate and regular rhythm Abdomen - soft, nontender, nondistended, no masses or organomegaly gravid Pelvic - exam declined by the patient, will do one in the OR Neurological - alert, oriented, normal speech, no focal findings or movement disorder noted, normal muscle tone, no tremors, strength 5/5 Musculoskeletal - no joint tenderness, deformity  Extremities - , no clubbing or cyanosis, pedal edema 2 + Skin - normal coloration and turgor, no rashes, no suspicious skin lesions noted  Prenatal labs:  ABO, Rh:  o pos Antibody:  neg Rubella:  immune RPR:   NR HBsAg:   neg HIV:   NR   Assessment/Plan: IUFD pt offered cesarean vs version with VBAC. R&B reviewed.  Pt desires to have a repeat cesarean section All risk reviewed will proceed   Miranda Hall A 01/13/2012, 11:18 AM

## 2012-01-13 NOTE — Anesthesia Procedure Notes (Signed)
Spinal  Patient location during procedure: OR Start time: 01/13/2012 4:03 PM End time: 01/13/2012 4:06 PM Staffing Anesthesiologist: Sandrea Hughs Performed by: anesthesiologist  Preanesthetic Checklist Completed: patient identified, site marked, surgical consent, pre-op evaluation, timeout performed, IV checked, risks and benefits discussed and monitors and equipment checked Spinal Block Patient position: sitting Prep: DuraPrep Patient monitoring: heart rate, cardiac monitor, continuous pulse ox and blood pressure Approach: midline Location: L3-4 Injection technique: single-shot Needle Needle type: Sprotte  Needle gauge: 24 G Needle length: 9 cm Needle insertion depth: 6 cm Assessment Sensory level: T4

## 2012-01-13 NOTE — Anesthesia Preprocedure Evaluation (Signed)
Anesthesia Evaluation  Patient identified by MRN, date of birth, ID band Patient awake    Reviewed: Allergy & Precautions, H&P , NPO status , Patient's Chart, lab work & pertinent test results  Airway Mallampati: II TM Distance: >3 FB Neck ROM: full    Dental No notable dental hx.    Pulmonary neg pulmonary ROS,  breath sounds clear to auscultation  Pulmonary exam normal       Cardiovascular negative cardio ROS      Neuro/Psych negative neurological ROS  negative psych ROS   GI/Hepatic negative GI ROS, Neg liver ROS,   Endo/Other  negative endocrine ROS  Renal/GU negative Renal ROS  negative genitourinary   Musculoskeletal negative musculoskeletal ROS (+)   Abdominal Normal abdominal exam  (+)   Peds negative pediatric ROS (+)  Hematology negative hematology ROS (+)   Anesthesia Other Findings   Reproductive/Obstetrics (+) Pregnancy                           Anesthesia Physical Anesthesia Plan  ASA: II  Anesthesia Plan: Spinal   Post-op Pain Management:    Induction:   Airway Management Planned:   Additional Equipment:   Intra-op Plan:   Post-operative Plan:   Informed Consent: I have reviewed the patients History and Physical, chart, labs and discussed the procedure including the risks, benefits and alternatives for the proposed anesthesia with the patient or authorized representative who has indicated his/her understanding and acceptance.     Plan Discussed with: CRNA and Surgeon  Anesthesia Plan Comments:         Anesthesia Quick Evaluation

## 2012-01-13 NOTE — Patient Instructions (Signed)
To [facility].  ON call cnm notofied

## 2012-01-13 NOTE — Progress Notes (Signed)
No  FHTs heard by 3 providers documented on [place] that pt has suffered an IUFD.  Dr. Normand Sloop notified.  She will come to office to counsel pt on her options.

## 2012-01-13 NOTE — Transfer of Care (Signed)
Immediate Anesthesia Transfer of Care Note  Patient: Miranda Hall  Procedure(s) Performed: Procedure(s) (LRB): CESAREAN SECTION (N/A)  Patient Location: PACU  Anesthesia Type: Spinal  Level of Consciousness: awake, alert  and oriented  Airway & Oxygen Therapy: Patient Spontanous Breathing  Post-op Assessment: Report given to PACU RN  Post vital signs: Reviewed and stable  Complications: No apparent anesthesia complications

## 2012-01-14 ENCOUNTER — Encounter (HOSPITAL_COMMUNITY): Payer: Self-pay | Admitting: Obstetrics and Gynecology

## 2012-01-14 LAB — CBC
MCH: 29.7 pg (ref 26.0–34.0)
MCV: 90.7 fL (ref 78.0–100.0)
Platelets: 153 10*3/uL (ref 150–400)
RBC: 3.43 MIL/uL — ABNORMAL LOW (ref 3.87–5.11)
RDW: 14.1 % (ref 11.5–15.5)

## 2012-01-14 NOTE — Addendum Note (Signed)
Addendum  created 01/14/12 1100 by Shanon Payor, CRNA   Modules edited:Notes Section

## 2012-01-14 NOTE — Progress Notes (Signed)
Subjective: Postpartum Day 1: Cesarean Delivery Patient reports tolerating PO.    Objective: Vital signs in last 24 hours: Temp:  [97.7 F (36.5 C)-98.3 F (36.8 C)] 97.7 F (36.5 C) (05/22 1800) Pulse Rate:  [66-90] 76  (05/22 1800) Resp:  [16-18] 18  (05/22 1800) BP: (103-121)/(65-79) 121/79 mmHg (05/22 1800) SpO2:  [96 %-100 %] 99 % (05/22 1800)  Physical Exam:  General: alert and cooperative Lochia: appropriate Uterine Fundus: firm Incision: healing well, no significant drainage, no significant erythema   Basename 01/14/12 0455 01/13/12 1525  HGB 10.2* 11.6*  HCT 31.1* 35.3*    Assessment/Plan: Status post Cesarean section. Doing well postoperatively. Reports that she is doing well emotionally with the loss of her infant.  Continue current care.  Tye Vigo V 01/14/2012, 11:08 PM

## 2012-01-14 NOTE — Progress Notes (Signed)
01/14/12 0910  Clinical Encounter Type  Visited With Patient and family together (Husband Remer)  Visit Type Follow-up;Spiritual support;Social support  Spiritual Encounters  Spiritual Needs Emotional;Grief support    Received report from [place] re his spiritual care visit last night, and then made follow-up visit this morning to check on family.  Amelita and Romeo Apple report very strong support from family and friends, and were appreciating having a quiet moment together.  We agreed for me to stop by this afternoon to check on them, possibly visiting at greater length if they needed more support and/or had privacy then.  [address] [place], [place] [phone]

## 2012-01-14 NOTE — Progress Notes (Signed)
Subjective: Postpartum Day 1: Cesarean Delivery Patient reports tolerating PO, + flatus and no problems voiding. Pt reports "no" vaginal bleeding.  Spont voids since Foley d/c'd.  Her husband, and another female visitor at bedside.  Decline autopsy.  Unsure about funeral services thus far.  Plans to shower this afternoon.   Some incisional pain w/ movements, but pains meds helping.    Objective: Vital signs in last 24 hours: Temp:  [97.4 F (36.3 C)-98.4 F (36.9 C)] 97.9 F (36.6 C) (05/22 1400) Pulse Rate:  [61-98] 75  (05/22 1400) Resp:  [14-20] 16  (05/22 1400) BP: (103-135)/(16-81) 121/75 mmHg (05/22 1400) SpO2:  [96 %-100 %] 99 % (05/22 1400) Weight:  [180 lb (81.647 kg)] 180 lb (81.647 kg) (05/21 2100)  Physical Exam:  General: alert, cooperative, no distress and appropriate Abd: BS+ x4 Lochia: appropriate Uterine Fundus: firm Incision: post-op dsg c/d/i with no drainage DVT Evaluation: No evidence of DVT seen on physical exam. Negative Homan's sign. No significant calf/ankle edema.   Basename 01/14/12 0455 01/13/12 1525  HGB 10.2* 11.6*  HCT 31.1* 35.3*    Assessment/Plan: IUFD at 37.2 weeks.  Status post repeat Cesarean section. Doing well postoperatively.  Support given and pt very thankful of care received.  Continue current care.  Riven Beebe H 01/14/2012, 4:51 PM

## 2012-01-14 NOTE — Progress Notes (Signed)
01/14/12 1400  Clinical Encounter Type  Visited With Patient and family together (Husband Romeo Apple, pt's sister Angie)  Visit Type Follow-up;Spiritual support;Social support  Spiritual Encounters  Spiritual Needs Emotional;Grief support  Stress Factors  Patient Stress Factors (Anxious to get birth/death certificate paperwork completed)    Family again reported strong support.  Tylisa was really feeling grateful that family and faith community will lift her up through this experience.  [place] is full of floral arrangements, and family was touched that gift shop gave Glynis's mother an angel remembrance plaque as a gesture of compassion.  Toba and Romeo Apple were anxious to complete paperwork for birth and death certificates.  I consulted House Coverage and reported Brianne's (spelling?) plan to family:  Birth certificates are created for babies born alive, but there will simply be a death certificate in a case in which baby is not born alive.  Per Dena Billet, she is waiting to confirm death certificate details/process with Rebecca Eaton, Admissions, and then will provide information about paperwork and steps directly to family herself.  (Thank you for that assistance!)  Caro Hight, [place] [phone]

## 2012-01-14 NOTE — Anesthesia Postprocedure Evaluation (Signed)
  Anesthesia Post-op Note  Patient: Miranda Hall  Procedure(s) Performed: Procedure(s) (LRB): CESAREAN SECTION (N/A)  Patient Location: [facility]  Anesthesia Type: Spinal  Level of Consciousness: awake, alert  and oriented  Airway and Oxygen Therapy: Patient Spontanous Breathing  Post-op Pain: none  Post-op Assessment: Post-op Vital signs reviewed, Patient's Cardiovascular Status Stable, No headache, No backache, No residual numbness and No residual motor weakness  Post-op Vital Signs: Reviewed and stable  Complications: No apparent anesthesia complications

## 2012-01-15 MED ORDER — ZOLPIDEM TARTRATE 10 MG PO TABS: 10.0000 mg | ORAL_TABLET | Freq: Every evening | ORAL | Status: DC | PRN

## 2012-01-15 MED ORDER — OXYCODONE-ACETAMINOPHEN 5-325 MG PO TABS: 1.0000 | ORAL_TABLET | ORAL | Status: AC | PRN

## 2012-01-15 MED ORDER — CYCLOBENZAPRINE HCL 5 MG PO TABS: ORAL_TABLET | ORAL | Status: DC

## 2012-01-15 MED ORDER — IBUPROFEN 600 MG PO TABS: 600.0000 mg | ORAL_TABLET | Freq: Four times a day (QID) | ORAL | Status: AC | PRN

## 2012-01-15 NOTE — Discharge Summary (Signed)
Obstetric Discharge Summary Reason for Admission: intrauterine fetal demise Prenatal Procedures: ultrasound Intrapartum Procedures: cesarean: low cervical, transverse Postpartum Procedures: none Complications-Operative and Postpartum: none Hemoglobin  Date Value Range Status  01/14/2012 10.2* 12.0-15.0 (g/dL) Final     HCT  Date Value Range Status  01/14/2012 31.1* 36.0-46.0 (%) Final    Physical Exam:  General: alert, cooperative, no distress and Demeanor appropriate for mother grieving the loss of her term infant Lochia: appropriate Uterine Fundus: firm Incision: healing well, no significant drainage, no dehiscence, no significant erythema DVT Evaluation: No evidence of DVT seen on physical exam.  Discharge Diagnoses: Term Pregnancy-delivered and intrauterine fetal demise at term     Previous cesarean section with desire for repeat cesarean section  Discharge Information: Date: 01/15/2012 Activity: pelvic rest and As noted in practice discharge instructions Diet: routine Medications: As noted in the after visit summary Condition: stable and appropriately grieving loss of her term infant Instructions: refer to practice specific booklet Discharge to: home Follow-up Information    Follow up with [facility] A, MD. Schedule an appointment as soon as possible for a visit in 6 weeks. (As needed if symptoms worsen)    Contact information:   [address] [place] [address] [phone]          Newborn Data: Miranda Hall female infant  APGAR:0,0  Miranda Hall 01/15/2012, 9:32 AM

## 2012-01-15 NOTE — Progress Notes (Signed)
UR Chart review completed.  

## 2012-01-15 NOTE — Discharge Instructions (Signed)

## 2012-01-21 ENCOUNTER — Encounter (HOSPITAL_COMMUNITY): Payer: Self-pay | Admitting: *Deleted

## 2012-01-21 ENCOUNTER — Other Ambulatory Visit (HOSPITAL_COMMUNITY): Payer: Self-pay

## 2012-01-21 NOTE — Telephone Encounter (Signed)
Triage/elect. 

## 2012-01-21 NOTE — Telephone Encounter (Signed)
TC to pt. LM to return call regarding message. 

## 2012-01-22 ENCOUNTER — Telehealth: Payer: Self-pay | Admitting: Obstetrics and Gynecology

## 2012-01-22 NOTE — Telephone Encounter (Signed)
Grief card mailed for loss on 01/13/12  -Adrianne Pridgen

## 2012-01-28 ENCOUNTER — Telehealth: Payer: Self-pay

## 2012-01-28 NOTE — Telephone Encounter (Signed)
Lm with pt's husband for pt to call office rgdg follow up of pp depression.

## 2012-01-29 NOTE — Telephone Encounter (Signed)
Tc to pt per follow up of pp depression. Pt states,"she is doing fine and doesn't feel she needs an increase in her dosage of Zoloft at this time". Informed pt to call the office if needed. Pt voices understanding.

## 2012-03-01 ENCOUNTER — Telehealth: Payer: Self-pay | Admitting: Obstetrics and Gynecology

## 2012-03-01 NOTE — Telephone Encounter (Signed)
Triage/rx °

## 2012-03-02 ENCOUNTER — Encounter: Payer: Self-pay | Admitting: Obstetrics and Gynecology

## 2012-03-02 ENCOUNTER — Telehealth: Payer: Self-pay

## 2012-03-02 MED ORDER — SERTRALINE HCL 50 MG PO TABS: 50.0000 mg | ORAL_TABLET | Freq: Every day | ORAL | Status: DC

## 2012-03-02 NOTE — Telephone Encounter (Addendum)
Lm on vm to call back   Ok to call in refill on Zoloft per Dr Pennie Rushing. One month supply sent to [facility] in [place]. Lm on pt vm that rx called in.

## 2012-03-02 NOTE — Telephone Encounter (Signed)
Pt would like refill of Zoloft. Is this ok to send to her pharmacy?

## 2012-03-04 ENCOUNTER — Telehealth: Payer: Self-pay

## 2012-03-04 ENCOUNTER — Ambulatory Visit (INDEPENDENT_AMBULATORY_CARE_PROVIDER_SITE_OTHER): Payer: Medicaid Other | Admitting: Obstetrics and Gynecology

## 2012-03-04 ENCOUNTER — Encounter: Payer: Self-pay | Admitting: Obstetrics and Gynecology

## 2012-03-04 VITALS — BP 120/80 | Resp 16 | Wt 162.0 lb

## 2012-03-04 DIAGNOSIS — N898 Other specified noninflammatory disorders of vagina: Secondary | ICD-10-CM

## 2012-03-04 DIAGNOSIS — Z3200 Encounter for pregnancy test, result unknown: Secondary | ICD-10-CM

## 2012-03-04 LAB — POCT WET PREP (WET MOUNT)
Clue Cells Wet Prep Whiff POC: NEGATIVE
Trichomonas Wet Prep HPF POC: NEGATIVE
WBC, Wet Prep HPF POC: NEGATIVE

## 2012-03-04 MED ORDER — SERTRALINE HCL 50 MG PO TABS: 100.0000 mg | ORAL_TABLET | Freq: Every day | ORAL | Status: DC

## 2012-03-04 NOTE — Progress Notes (Signed)
Date of delivery: 01/13/12 Female Name: Miranda Hall  Vaginal delivery:no Cesarean section:yes Tubal ligation:no GDM:no Breast Feeding:no Bottle Feeding:no Post-Partum Blues:yes Abnormal pap:no Normal GU function: yes Normal GI function:yes Returning to work:no EPDS: 18  *No desire for [facility] at this time.

## 2012-03-04 NOTE — Progress Notes (Signed)
S: talkative, no tears, denies thoughts of suicide/no plan no thoughts of harming anyone      Bleeding stopped, asks for increase in zoloft dose O VSS     abd soft, nontender     Diastasis recti finger 3 breath     Normal hair distrubition mons pubis,      EGBUS and perineum WNL,  vagina  pink, moist normal rugae, good vaginal tone cerix LTC, no cervical motion tenderness, No adnexal masses or tenderness uterus firm small Discharge scant white neg clue, trich, and hyphae     A normal involution        Post cesarean for term IUFD        depression        6 weeks PP P Patient counseled regarding medication with zoloft increase to 100 mg daily and agrees to counseling referral now to [facility]. Discussed need for primary or mental health to prescribe meds as we can not continue to prescribe,S/s depression, suicide thoughts to report reviewed; she was told to come back here or go to nearest ER for any worsening symptoms. Declines birth control. Notified of neg pg test today. Lavera Guise, CNM

## 2012-03-04 NOTE — Telephone Encounter (Signed)
Spoke with pt. Pt needs referral to [facility] per [facility] but Nita from [facility] states they don't accept Pregnancy Medicaid; however, Nita informed us [employer] does. Called [facility] [phone] pt can walk in M-F 8:30-4:00 pm and there is no appt needed. Pt states she will go on Monday. Will inform MK.

## 2012-03-25 ENCOUNTER — Telehealth: Payer: Self-pay

## 2012-03-25 NOTE — Telephone Encounter (Signed)
Message copied by Rolla Plate on Thu Mar 25, 2012 10:52 AM ------      Message from: Jaymes Graff      Created: Tue Mar 23, 2012  1:05 AM       Please make sure the patient has had follow up at the [facility]      ----- Message -----         From: Lavera Guise, CNM         Sent: 03/04/2012   2:25 PM           To: Michael Litter, MD

## 2012-03-25 NOTE — Telephone Encounter (Signed)
Lm on vm tcb rgd appt with [facility] for ppd

## 2012-04-19 ENCOUNTER — Other Ambulatory Visit: Payer: Self-pay | Admitting: Obstetrics and Gynecology

## 2012-04-19 MED ORDER — SERTRALINE HCL 50 MG PO TABS: 100.0000 mg | ORAL_TABLET | Freq: Every day | ORAL | Status: DC

## 2012-04-19 MED ORDER — SERTRALINE HCL 50 MG PO TABS: 50.0000 mg | ORAL_TABLET | Freq: Every day | ORAL | Status: DC

## 2012-04-19 NOTE — Telephone Encounter (Signed)
TC from pt. States was prescribed Zoloft 100 mg but is taking 50mg  /day and feels fine most days. States her moods are pretty regular and is doing much better than previously. Denies suicidal or homicidal thoughts. Has not been seen for counseling. Does not recall referral to [facility] medical services 03/04/12 per TG note. Currently does not have Medicaid due to change of address that was not processed.  Requesting RF Zoloft. Advised to contact [facility] for F/U.  Per MK to OK to RF Zoloft x 1 month. Pt to F/U with PCP when Medicaid obtained.  Pt verbalizes comprehension.  Rx E-scribed.  TC to [facility] at [facility]. Clarified dosage is Zoloft 50mg  1 po qd # 30 ) RF.

## 2012-04-19 NOTE — Telephone Encounter (Signed)
TRIAGE/RX FOLLOW UP °

## 2012-04-19 NOTE — Telephone Encounter (Signed)
Message received from pharmacy re: RX RF. TC to pharmacy. Pt requesting RF Zoloft. TC to pt. LM to return call.

## 2014-06-26 ENCOUNTER — Encounter: Payer: Self-pay | Admitting: Obstetrics and Gynecology

## 2015-07-12 ENCOUNTER — Encounter (HOSPITAL_COMMUNITY): Payer: Self-pay

## 2015-07-26 ENCOUNTER — Encounter (HOSPITAL_COMMUNITY): Payer: Self-pay

## 2015-07-26 ENCOUNTER — Ambulatory Visit (HOSPITAL_COMMUNITY)
Admission: RE | Admit: 2015-07-26 | Discharge: 2015-07-26 | Disposition: A | Discharge status: Home / Self Care | Payer: 59 | Source: Ambulatory Visit | Attending: Obstetrics and Gynecology | Admitting: Obstetrics and Gynecology

## 2015-07-26 ENCOUNTER — Encounter (HOSPITAL_COMMUNITY)
Admission: RE | Disposition: A | Discharge status: Home / Self Care | Payer: Self-pay | Source: Ambulatory Visit | Attending: Obstetrics and Gynecology

## 2015-07-26 ENCOUNTER — Ambulatory Visit (HOSPITAL_COMMUNITY): Payer: 59 | Admitting: Anesthesiology

## 2015-07-26 DIAGNOSIS — N801 Endometriosis of ovary: Secondary | ICD-10-CM | POA: Insufficient documentation

## 2015-07-26 DIAGNOSIS — N926 Irregular menstruation, unspecified: Secondary | ICD-10-CM | POA: Insufficient documentation

## 2015-07-26 DIAGNOSIS — N979 Female infertility, unspecified: Secondary | ICD-10-CM | POA: Diagnosis not present

## 2015-07-26 DIAGNOSIS — N9412 Deep dyspareunia: Secondary | ICD-10-CM | POA: Diagnosis not present

## 2015-07-26 HISTORY — DX: Depression, unspecified: F32.A

## 2015-07-26 HISTORY — PX: LAPAROSCOPY: SHX197

## 2015-07-26 HISTORY — DX: Anxiety disorder, unspecified: F41.9

## 2015-07-26 HISTORY — PX: LYSIS OF ADHESION: SHX5961

## 2015-07-26 HISTORY — DX: Major depressive disorder, single episode, unspecified: F32.9

## 2015-07-26 HISTORY — DX: Nausea with vomiting, unspecified: Z98.890

## 2015-07-26 HISTORY — DX: Headache: R51

## 2015-07-26 HISTORY — DX: Headache, unspecified: R51.9

## 2015-07-26 HISTORY — DX: Nausea with vomiting, unspecified: R11.2

## 2015-07-26 HISTORY — PX: CHROMOPERTUBATION: SHX6288

## 2015-07-26 LAB — CBC
HEMATOCRIT: 40.3 % (ref 36.0–46.0)
HEMOGLOBIN: 13.5 g/dL (ref 12.0–15.0)
MCH: 29.5 pg (ref 26.0–34.0)
MCHC: 33.5 g/dL (ref 30.0–36.0)
MCV: 88 fL (ref 78.0–100.0)
Platelets: 228 10*3/uL (ref 150–400)
RBC: 4.58 MIL/uL (ref 3.87–5.11)
RDW: 13.2 % (ref 11.5–15.5)
WBC: 6.2 10*3/uL (ref 4.0–10.5)

## 2015-07-26 LAB — PREGNANCY, URINE: Preg Test, Ur: NEGATIVE

## 2015-07-26 SURGERY — LAPAROSCOPY OPERATIVE
Anesthesia: General

## 2015-07-26 MED ORDER — METHYLENE BLUE 1 % INJ SOLN
INTRAMUSCULAR | Status: AC
  Filled 2015-07-26: qty 1

## 2015-07-26 MED ORDER — LACTATED RINGERS IV SOLN
INTRAVENOUS | Status: DC
  Administered 2015-07-26: 10:00:00 via INTRAVENOUS
  Administered 2015-07-26: 10 mL/h via INTRAVENOUS

## 2015-07-26 MED ORDER — LACTATED RINGERS IR SOLN
Status: DC | PRN
  Administered 2015-07-26: 500 mL

## 2015-07-26 MED ORDER — PROPOFOL 10 MG/ML IV BOLUS
INTRAVENOUS | Status: DC | PRN
  Administered 2015-07-26: 200 mg via INTRAVENOUS

## 2015-07-26 MED ORDER — METOCLOPRAMIDE HCL 5 MG/ML IJ SOLN
10.0000 mg | Freq: Once | INTRAMUSCULAR | Status: AC
  Administered 2015-07-26: 10 mg via INTRAVENOUS

## 2015-07-26 MED ORDER — BUPIVACAINE HCL (PF) 0.25 % IJ SOLN
INTRAMUSCULAR | Status: AC
  Filled 2015-07-26: qty 30

## 2015-07-26 MED ORDER — ONDANSETRON HCL 4 MG/2ML IJ SOLN
INTRAMUSCULAR | Status: AC
  Filled 2015-07-26: qty 2

## 2015-07-26 MED ORDER — SODIUM CHLORIDE 0.9 % IJ SOLN
INTRAMUSCULAR | Status: AC
  Filled 2015-07-26: qty 10

## 2015-07-26 MED ORDER — HYDROMORPHONE HCL 1 MG/ML IJ SOLN
0.2500 mg | INTRAMUSCULAR | Status: DC | PRN
  Administered 2015-07-26 (×2): 0.5 mg via INTRAVENOUS

## 2015-07-26 MED ORDER — BUPIVACAINE HCL (PF) 0.25 % IJ SOLN
INTRAMUSCULAR | Status: DC | PRN
  Administered 2015-07-26: 10 mL

## 2015-07-26 MED ORDER — HYDROMORPHONE HCL 1 MG/ML IJ SOLN
INTRAMUSCULAR | Status: AC
  Administered 2015-07-26: 0.5 mg via INTRAVENOUS
  Filled 2015-07-26: qty 1

## 2015-07-26 MED ORDER — GLYCOPYRROLATE 0.2 MG/ML IJ SOLN
INTRAMUSCULAR | Status: AC
Start: 2015-07-26 — End: 2015-07-26
  Filled 2015-07-26: qty 4

## 2015-07-26 MED ORDER — LIDOCAINE HCL (CARDIAC) 20 MG/ML IV SOLN
INTRAVENOUS | Status: DC | PRN
  Administered 2015-07-26: 100 mg via INTRAVENOUS

## 2015-07-26 MED ORDER — DEXAMETHASONE SODIUM PHOSPHATE 10 MG/ML IJ SOLN
INTRAMUSCULAR | Status: AC
Start: 2015-07-26 — End: 2015-07-26
  Filled 2015-07-26: qty 1

## 2015-07-26 MED ORDER — HYDROCODONE-ACETAMINOPHEN 5-325 MG PO TABS: 1.0000 | ORAL_TABLET | Freq: Four times a day (QID) | ORAL | Status: DC | PRN

## 2015-07-26 MED ORDER — SCOPOLAMINE 1 MG/3DAYS TD PT72
MEDICATED_PATCH | TRANSDERMAL | Status: AC
  Administered 2015-07-26: 1.5 mg via TRANSDERMAL
  Filled 2015-07-26: qty 1

## 2015-07-26 MED ORDER — KETOROLAC TROMETHAMINE 30 MG/ML IJ SOLN
INTRAMUSCULAR | Status: DC | PRN
  Administered 2015-07-26: 30 mg via INTRAVENOUS

## 2015-07-26 MED ORDER — GLYCOPYRROLATE 0.2 MG/ML IJ SOLN
INTRAMUSCULAR | Status: DC | PRN
  Administered 2015-07-26: .8 mg via INTRAVENOUS

## 2015-07-26 MED ORDER — HEPARIN SODIUM (PORCINE) 5000 UNIT/ML IJ SOLN
INTRAMUSCULAR | Status: AC
  Filled 2015-07-26: qty 1

## 2015-07-26 MED ORDER — NEOSTIGMINE METHYLSULFATE 10 MG/10ML IV SOLN
INTRAVENOUS | Status: DC | PRN
Start: 2015-07-26 — End: 2015-07-26
  Administered 2015-07-26: 5 mg via INTRAVENOUS

## 2015-07-26 MED ORDER — MIDAZOLAM HCL 2 MG/2ML IJ SOLN
INTRAMUSCULAR | Status: AC
  Filled 2015-07-26: qty 2

## 2015-07-26 MED ORDER — ROCURONIUM BROMIDE 100 MG/10ML IV SOLN
INTRAVENOUS | Status: DC | PRN
  Administered 2015-07-26: 40 mg via INTRAVENOUS

## 2015-07-26 MED ORDER — KETOROLAC TROMETHAMINE 30 MG/ML IJ SOLN: 30.0000 mg | Freq: Once | INTRAMUSCULAR | Status: DC | PRN

## 2015-07-26 MED ORDER — FENTANYL CITRATE (PF) 250 MCG/5ML IJ SOLN
INTRAMUSCULAR | Status: AC
  Filled 2015-07-26: qty 5

## 2015-07-26 MED ORDER — MIDAZOLAM HCL 5 MG/5ML IJ SOLN
INTRAMUSCULAR | Status: DC | PRN
  Administered 2015-07-26: 2 mg via INTRAVENOUS

## 2015-07-26 MED ORDER — LIDOCAINE HCL (CARDIAC) 20 MG/ML IV SOLN
INTRAVENOUS | Status: AC
  Filled 2015-07-26: qty 5

## 2015-07-26 MED ORDER — FENTANYL CITRATE (PF) 100 MCG/2ML IJ SOLN
INTRAMUSCULAR | Status: DC | PRN
  Administered 2015-07-26 (×2): 100 ug via INTRAVENOUS
  Administered 2015-07-26: 50 ug via INTRAVENOUS

## 2015-07-26 MED ORDER — KETOROLAC TROMETHAMINE 30 MG/ML IJ SOLN
INTRAMUSCULAR | Status: AC
  Filled 2015-07-26: qty 1

## 2015-07-26 MED ORDER — PROPOFOL 10 MG/ML IV BOLUS
INTRAVENOUS | Status: AC
  Filled 2015-07-26: qty 20

## 2015-07-26 MED ORDER — SCOPOLAMINE 1 MG/3DAYS TD PT72
1.0000 | MEDICATED_PATCH | Freq: Once | TRANSDERMAL | Status: DC
Start: 2015-07-26 — End: 2015-07-26
  Administered 2015-07-26: 1.5 mg via TRANSDERMAL

## 2015-07-26 MED ORDER — METHYLENE BLUE 1 % INJ SOLN
INTRAMUSCULAR | Status: DC | PRN
  Administered 2015-07-26: 80 mL via SUBMUCOSAL

## 2015-07-26 MED ORDER — MEPERIDINE HCL 25 MG/ML IJ SOLN: 6.2500 mg | INTRAMUSCULAR | Status: DC | PRN

## 2015-07-26 MED ORDER — DEXAMETHASONE SODIUM PHOSPHATE 10 MG/ML IJ SOLN
INTRAMUSCULAR | Status: DC | PRN
  Administered 2015-07-26: 10 mg via INTRAVENOUS

## 2015-07-26 MED ORDER — ONDANSETRON HCL 4 MG/2ML IJ SOLN
INTRAMUSCULAR | Status: DC | PRN
  Administered 2015-07-26: 4 mg via INTRAVENOUS

## 2015-07-26 MED ORDER — ONDANSETRON HCL 4 MG/2ML IJ SOLN: 4.0000 mg | Freq: Once | INTRAMUSCULAR | Status: DC | PRN

## 2015-07-26 MED ORDER — METOCLOPRAMIDE HCL 5 MG/ML IJ SOLN
INTRAMUSCULAR | Status: AC
  Filled 2015-07-26: qty 2

## 2015-07-26 SURGICAL SUPPLY — 36 items
BAG SPEC RTRVL LRG 6X4 10 (ENDOMECHANICALS)
CABLE HIGH FREQUENCY MONO STRZ (ELECTRODE) IMPLANT
CATH ROBINSON RED A/P 16FR (CATHETERS) IMPLANT
CHLORAPREP W/TINT 26ML (MISCELLANEOUS) ×3 IMPLANT
CLOTH BEACON ORANGE TIMEOUT ST (SAFETY) ×3 IMPLANT
DECANTER SPIKE VIAL GLASS SM (MISCELLANEOUS) ×3 IMPLANT
DRSG COVADERM PLUS 2X2 (GAUZE/BANDAGES/DRESSINGS) ×6 IMPLANT
DRSG OPSITE POSTOP 3X4 (GAUZE/BANDAGES/DRESSINGS) ×2 IMPLANT
GLOVE BIO SURGEON STRL SZ8 (GLOVE) ×3 IMPLANT
GLOVE BIOGEL PI IND STRL 7.0 (GLOVE) ×1 IMPLANT
GLOVE BIOGEL PI INDICATOR 7.0 (GLOVE) ×2
GLOVE ORTHO TXT STRL SZ7.5 (GLOVE) ×3 IMPLANT
GOWN STRL REUS W/TWL 2XL LVL3 (GOWN DISPOSABLE) ×3 IMPLANT
GOWN STRL REUS W/TWL LRG LVL3 (GOWN DISPOSABLE) ×6 IMPLANT
LIQUID BAND (GAUZE/BANDAGES/DRESSINGS) ×3 IMPLANT
MANIPULATOR UTERINE 4.5 ZUMI (MISCELLANEOUS) ×2 IMPLANT
NDL EPID 17G 5 ECHO TUOHY (NEEDLE) IMPLANT
NEEDLE EPID 17G 5 ECHO TUOHY (NEEDLE) IMPLANT
NEEDLE INSUFFLATION 120MM (ENDOMECHANICALS) ×3 IMPLANT
NS IRRIG 1000ML POUR BTL (IV SOLUTION) ×3 IMPLANT
PACK LAPAROSCOPY BASIN (CUSTOM PROCEDURE TRAY) ×3 IMPLANT
PAD POSITIONING PINK XL (MISCELLANEOUS) ×3 IMPLANT
POUCH SPECIMEN RETRIEVAL 10MM (ENDOMECHANICALS) IMPLANT
SET IRRIG TUBING LAPAROSCOPIC (IRRIGATION / IRRIGATOR) IMPLANT
SHEARS HARMONIC ACE PLUS 36CM (ENDOMECHANICALS) IMPLANT
SLEEVE XCEL OPT CAN 5 100 (ENDOMECHANICALS) IMPLANT
SOLUTION ELECTROLUBE (MISCELLANEOUS) IMPLANT
SUT VICRYL 0 UR6 27IN ABS (SUTURE) IMPLANT
SUT VICRYL 4-0 PS2 18IN ABS (SUTURE) ×3 IMPLANT
TOWEL OR 17X24 6PK STRL BLUE (TOWEL DISPOSABLE) ×6 IMPLANT
TRAY FOLEY CATH SILVER 14FR (SET/KITS/TRAYS/PACK) ×2 IMPLANT
TROCAR BALLN 12MMX100 BLUNT (TROCAR) IMPLANT
TROCAR XCEL NON-BLD 11X100MML (ENDOMECHANICALS) IMPLANT
TROCAR XCEL NON-BLD 5MMX100MML (ENDOMECHANICALS) ×3 IMPLANT
WARMER LAPAROSCOPE (MISCELLANEOUS) ×3 IMPLANT
WATER STERILE IRR 1000ML POUR (IV SOLUTION) ×3 IMPLANT

## 2015-07-26 NOTE — H&P (Signed)
Miranda CheshireCallie M Hall is an 34 y.o. female. She was seen in October as a new patient.  Has irregular menses, LMP August, PMP May, menses prior to that was several months. Normal FSH, AMH, TSH, low prolactin in May. Sexually active, deep dyspareunia every time since last c-section for fetal demise, currently trying to conceive. Did 3 cycles of clomid since May but did not ovulate. Normal Pap last year.    Pertinent Gynecological History: OB History: G2, P2001 C-section x 2, second one fetal demise with cord accident   Menstrual History:  Patient's last menstrual period was 06/26/2015 (exact date).    Past Medical History  Diagnosis Date  . H/O candidiasis   . H/O varicella   . Anemia     during pregnancy  . H/O postpartum depression, currently pregnant   . Hx of migraines   . Depression   . Anxiety   . Headache     Migraines  . PONV (postoperative nausea and vomiting)     Past Surgical History  Procedure Laterality Date  . Cesarean section  2010  . Wisdom tooth extraction      age 34  . Cesarean section  01/13/2012    Procedure: CESAREAN SECTION;  Surgeon: Michael LitterNaima A Dillard, MD;  Location: [facility];  Service: Gynecology;  Laterality: N/A;  Repeat Cesarean Section    Family History  Problem Relation Age of Onset  . Depression Sister   . Kidney disease Maternal Aunt   . Cancer Maternal Grandmother   . Cancer Maternal Grandfather   . Heart disease Paternal Grandmother   . Hypertension Paternal Grandmother   . Drug abuse Paternal Grandmother   . Stroke Paternal Grandmother   . Cancer Paternal Grandmother   . Heart disease Paternal Grandfather   . Hypertension Paternal Grandfather   . Drug abuse Paternal Grandfather   . Stroke Paternal Grandfather   . Cancer Paternal Grandfather     Social History:  reports that she has never smoked. She has never used smokeless tobacco. She reports that she does not drink alcohol or use illicit drugs.  Allergies:  Allergies  Allergen  Reactions  . Food Anaphylaxis    Peanut allergy  . Imitrex [Sumatriptan] Shortness Of Breath    Feels like weight on chest  . Milk-Related Compounds Anaphylaxis    No prescriptions prior to admission    Review of Systems  Respiratory: Negative.   Cardiovascular: Negative.   Gastrointestinal: Negative.   Genitourinary: Negative.     Last menstrual period 06/26/2015. Physical Exam  Constitutional: She appears well-developed and well-nourished.  Neck: Neck supple. No thyromegaly present.  Cardiovascular: Normal rate, regular rhythm and normal heart sounds.   No murmur heard. Respiratory: Effort normal and breath sounds normal. No respiratory distress. She has no wheezes.  GI: Soft. She exhibits no distension and no mass. There is no tenderness.  Transverse scar  Genitourinary:  EGBUS- no lesion Vagina, tender PFM and fornices Cervix normal Uterus normal size, NT No adnexal mass or tenderness    No results found for this or any previous visit (from the past 24 hour(s)).  No results found.  Assessment/Plan: Dyspareunia and infertility, may have adhesions from c-sections.  All medical and surgical options have been discussed.  Laparoscopy with possible adhesiolysis or fulguration of endometriosis and chromopertubation procedure and risks discussed.  Will admit for mentioned procedure.    Barrett Holthaus D 07/26/2015, 12:37 AM

## 2015-07-26 NOTE — Discharge Instructions (Signed)
Routine instructions for laparoscopy  DISCHARGE INSTRUCTIONS: Laparoscopy  The following instructions have been prepared to help you care for yourself upon your return home today.  Wound care:  Do not get the incision wet for the first 24 hours. The incision should be kept clean and dry.  The Band-Aids or dressings may be removed the day after surgery.  Should the incision become sore, red, and swollen after the first week, check with your doctor.  Personal hygiene:  Shower the day after your procedure.  Activity and limitations:  Do NOT drive or operate any equipment today.  Do NOT lift anything more than 15 pounds for 2-3 weeks after surgery.  Do NOT rest in bed all day.  Walking is encouraged. Walk each day, starting slowly with 5-minute walks 3 or 4 times a day. Slowly increase the length of your walks.  Walk up and down stairs slowly.  Do NOT do strenuous activities, such as golfing, playing tennis, bowling, running, biking, weight lifting, gardening, mowing, or vacuuming for 2-4 weeks. Ask your doctor when it is okay to start.  Diet: Eat a light meal as desired this evening. You may resume your usual diet tomorrow.  Return to work: This is dependent on the type of work you do. For the most part you can return to a desk job within a week of surgery. If you are more active at work, please discuss this with your doctor.  What to expect after your surgery: You may have a slight burning sensation when you urinate on the first day. You may have a very small amount of blood in the urine. Expect to have a small amount of vaginal discharge/light bleeding for 1-2 weeks. It is not unusual to have abdominal soreness and bruising for up to 2 weeks. You may be tired and need more rest for about 1 week. You may experience shoulder pain for 24-72 hours. Lying flat in bed may relieve it.  NO IBUPROFEN PRODUCTS (MOTRIN, ADVIL) OR ALEVE UNTIL 4:10PM TODAY.  Scope patch may be removed on or  before 07/29/15  Call your doctor for any of the following:  Develop a fever of 100.4 or greater  Inability to urinate 6 hours after discharge from hospital  Severe pain not relieved by pain medications  Persistent of heavy bleeding at incision site  Redness or swelling around incision site after a week  Increasing nausea or vomiting  Patient Signature________________________________________ Nurse Signature_________________________________________

## 2015-07-26 NOTE — Anesthesia Preprocedure Evaluation (Addendum)
Anesthesia Evaluation  Patient identified by MRN, date of birth, ID band Patient awake    Reviewed: Allergy & Precautions, H&P , NPO status , Patient's Chart, lab work & pertinent test results  History of Anesthesia Complications (+) PONV  Airway Mallampati: I  TM Distance: >3 FB Neck ROM: full    Dental no notable dental hx. (+) Teeth Intact   Pulmonary neg pulmonary ROS,    breath sounds clear to auscultation       Cardiovascular negative cardio ROS Normal cardiovascular exam Rate:Normal     Neuro/Psych    GI/Hepatic negative GI ROS, Neg liver ROS,   Endo/Other  negative endocrine ROS  Renal/GU negative Renal ROS     Musculoskeletal   Abdominal Normal abdominal exam  (+)   Peds  Hematology negative hematology ROS (+)   Anesthesia Other Findings   Reproductive/Obstetrics negative OB ROS                            Anesthesia Physical Anesthesia Plan  ASA: II  Anesthesia Plan: General   Post-op Pain Management:    Induction: Intravenous  Airway Management Planned: Oral ETT  Additional Equipment:   Intra-op Plan:   Post-operative Plan: Extubation in OR  Informed Consent: I have reviewed the patients History and Physical, chart, labs and discussed the procedure including the risks, benefits and alternatives for the proposed anesthesia with the patient or authorized representative who has indicated his/her understanding and acceptance.   Dental Advisory Given  Plan Discussed with: CRNA  Anesthesia Plan Comments:         Anesthesia Quick Evaluation

## 2015-07-26 NOTE — Interval H&P Note (Signed)
History and Physical Interval Note:  07/26/2015 8:59 AM  Miranda Hall  has presented today for surgery, with the diagnosis of Dypareunia, Infertility  The various methods of treatment have been discussed with the patient and family. After consideration of risks, benefits and other options for treatment, the patient has consented to  Procedure(s) with comments: LAPAROSCOPY OPERATIVE (N/A) - REQUEST CASE FOR STACY RN, MELINDA CRNA, DEE CST LYSIS OF ADHESION (N/A) CHROMOPERTUBATION (N/A) as a surgical intervention .  The patient's history has been reviewed, patient examined, no change in status, stable for surgery.  I have reviewed the patient's chart and labs.  Questions were answered to the patient's satisfaction.     Latha Staunton D

## 2015-07-26 NOTE — Transfer of Care (Signed)
Immediate Anesthesia Transfer of Care Note  Patient: Miranda Hall  Procedure(s) Performed: Procedure(s) with comments: LAPAROSCOPY OPERATIVE (N/A) - REQUEST CASE FOR STACY RN, Dominico Rod CRNA, DEE CST LYSIS OF ADHESION (N/A) CHROMOPERTUBATION (N/A)  Patient Location: PACU  Anesthesia Type:General  Level of Consciousness: awake and sedated  Airway & Oxygen Therapy: Patient Spontanous Breathing and Patient connected to nasal cannula oxygen  Post-op Assessment: Report given to RN and Post -op Vital signs reviewed and stable  Post vital signs: Reviewed and stable  Last Vitals:  Filed Vitals:   07/26/15 0826  BP: 118/78  Pulse: 66  Temp: 36.7 C  Resp: 20    Complications: No apparent anesthesia complications

## 2015-07-26 NOTE — Anesthesia Postprocedure Evaluation (Signed)
Anesthesia Post Note  Patient: Miranda Hall  Procedure(s) Performed: Procedure(s) (LRB): LAPAROSCOPY OPERATIVE (N/A) LYSIS OF ADHESION (N/A) CHROMOPERTUBATION (N/A)  Patient location during evaluation: PACU Anesthesia Type: General Level of consciousness: awake and alert Pain management: pain level controlled Vital Signs Assessment: post-procedure vital signs reviewed and stable Respiratory status: spontaneous breathing, nonlabored ventilation, respiratory function stable and patient connected to nasal cannula oxygen Cardiovascular status: blood pressure returned to baseline and stable Postop Assessment: no signs of nausea or vomiting Anesthetic complications: no    Last Vitals:  Filed Vitals:   07/26/15 1200 07/26/15 1240  BP: 110/64 119/71  Pulse: 57 61  Temp:  36.3 C  Resp: 13 16    Last Pain:  Filed Vitals:   07/26/15 1249  PainSc: 3                  Shelton SilvasKevin D Cristan Scherzer

## 2015-07-26 NOTE — Op Note (Signed)
Preoperative diagnosis: Dyspareunia, secondary infertility Postoperative diagnosis: Same, possible small focus of endometriosis Procedure: Diagnostic laparoscopy, minimal adhesiolysis, minimal fulguration of peritoneal lesion, chromopertubation Surgeon: Lavina Hammanodd Keyia Moretto M.D.  Anesthesia: Gen. Endotracheal tube  Findings: She had a normal abdomen and pelvis with normal uterus tubes and ovaries, adhesion to left pelvic brim, small white area in right ovarian fossa, patent tubes Specimens: None  Estimated blood loss: Minimal  Complications: None   Procedure in detail:  The patient was taken to the operating room and placed in the dorsosupine position. General anesthesia was induced. Her legs were placed in mobile stirrups and her left arm was tucked to her side. Abdomen perineum and vagina were then prepped and draped in the usual sterile fashion, bladder drained with a foley catheter that was then removed, a ZUMI uterin manipulator was inserted after the cervix dilated to a size 19 dilator for chromopertubation. Infraumbilical skin was then infiltrated with quarter percent Marcaine and a 1 cm vertical incision was made. The veress needle was inserted into the peritoneal cavity and placement confirmed by the water drop test and an opening pressure of 6 mm of mercury. CO2 was insufflated to a pressure of 12 mm of mercury and the veress needle was removed. A 5mm disposable trocar was then introduced with direct visualization with the laparoscope. A 5 mm port was then placed on the left side also under direct visualization. Careful and thorough inspection revealed the above-mentioned findings with normal anatomy, no source for her pain was identified. There were a few small white areas in the right ovarian fossa that could be c/w endometriosis, these were fulgurated with bipolar cautery. There was one adhesions of bowel to the left pelvic brim and this was taken down bluntly and with scissors. The 5 mm port was  removed under direct visualization. All gas was allowed to deflate from the abdomen and the umbilical trocar was removed. Skin incisions were then closed with interrupted subcuticular sutures of 4-0 Vicryl followed by Dermabond. The ZUMI was removed. The patient was taken down from stirrups. She was awakened in the operating room and taken to the recovery room in stable condition after tolerating the procedure well. Counts were correct and she had PAS hose on throughout the procedure.

## 2015-07-26 NOTE — Anesthesia Procedure Notes (Signed)
Procedure Name: Intubation Date/Time: 07/26/2015 9:30 AM Performed by: Junious SilkGILBERT, Naoko Diperna Pre-anesthesia Checklist: Timeout performed, Patient identified, Emergency Drugs available, Suction available and Patient being monitored Patient Re-evaluated:Patient Re-evaluated prior to inductionOxygen Delivery Method: Circle system utilized Preoxygenation: Pre-oxygenation with 100% oxygen Intubation Type: IV induction Ventilation: Mask ventilation without difficulty Laryngoscope Size: Miller and 2 Grade View: Grade I Tube type: Oral Tube size: 7.0 mm Number of attempts: 1 Airway Equipment and Method: Stylet Placement Confirmation: ETT inserted through vocal cords under direct vision,  positive ETCO2,  CO2 detector and breath sounds checked- equal and bilateral Secured at: 21 cm Tube secured with: Tape Dental Injury: Teeth and Oropharynx as per pre-operative assessment

## 2015-07-27 ENCOUNTER — Encounter (HOSPITAL_COMMUNITY): Payer: Self-pay | Admitting: Obstetrics and Gynecology

## 2015-08-28 DIAGNOSIS — N979 Female infertility, unspecified: Secondary | ICD-10-CM | POA: Diagnosis not present

## 2015-09-04 ENCOUNTER — Ambulatory Visit: Payer: Self-pay

## 2015-09-04 ENCOUNTER — Other Ambulatory Visit: Payer: Self-pay | Admitting: Occupational Medicine

## 2015-09-04 DIAGNOSIS — M25521 Pain in right elbow: Secondary | ICD-10-CM

## 2015-09-11 MED FILL — LETROZOLE 2.5 MG TABLET: 2.5 | 10 days supply | Qty: 10 | Fill #0

## 2015-09-27 DIAGNOSIS — N979 Female infertility, unspecified: Secondary | ICD-10-CM | POA: Diagnosis not present

## 2015-10-15 MED FILL — LETROZOLE 2.5 MG TABLET: 2.5 | 5 days supply | Qty: 15 | Fill #0

## 2015-11-01 DIAGNOSIS — N979 Female infertility, unspecified: Secondary | ICD-10-CM | POA: Diagnosis not present

## 2015-12-05 DIAGNOSIS — E288 Other ovarian dysfunction: Secondary | ICD-10-CM | POA: Diagnosis not present

## 2015-12-05 DIAGNOSIS — E282 Polycystic ovarian syndrome: Secondary | ICD-10-CM | POA: Diagnosis not present

## 2015-12-05 DIAGNOSIS — Z3161 Procreative counseling and advice using natural family planning: Secondary | ICD-10-CM | POA: Diagnosis not present

## 2015-12-05 DIAGNOSIS — Z319 Encounter for procreative management, unspecified: Secondary | ICD-10-CM | POA: Diagnosis not present

## 2015-12-05 DIAGNOSIS — Z31430 Encounter of female for testing for genetic disease carrier status for procreative management: Secondary | ICD-10-CM | POA: Diagnosis not present

## 2015-12-05 MED FILL — MEDROXYPROGESTERONE 10 MG T: 10 | 5 days supply | Qty: 5 | Fill #0

## 2015-12-05 MED FILL — PROGESTERONE 200 MG CAPSULE: 200 | 14 days supply | Qty: 28 | Fill #0

## 2015-12-05 MED FILL — BD NEEDLES 30GX0.5: 30G X 1/2" | 15 days supply | Qty: 15 | Fill #0

## 2015-12-05 MED FILL — LETROZOLE 2.5 MG TABLET: 2.5 | 5 days supply | Qty: 15 | Fill #0

## 2015-12-05 MED FILL — BD 3 ML SYRINGE 18GX1-1/2: 18G X 1-1/2 | 15 days supply | Qty: 15 | Fill #0

## 2015-12-05 MED FILL — CLOBETASOL 0.05% CREAM: 0.05 | 20 days supply | Qty: 60 | Fill #2

## 2015-12-07 DIAGNOSIS — Z319 Encounter for procreative management, unspecified: Secondary | ICD-10-CM | POA: Diagnosis not present

## 2015-12-13 ENCOUNTER — Ambulatory Visit (INDEPENDENT_AMBULATORY_CARE_PROVIDER_SITE_OTHER): Payer: 59 | Admitting: Family Medicine

## 2015-12-13 ENCOUNTER — Encounter: Payer: Self-pay | Admitting: Family Medicine

## 2015-12-13 VITALS — BP 112/80 | HR 63 | Temp 98.1°F | Ht 62.0 in | Wt 180.0 lb

## 2015-12-13 DIAGNOSIS — Z13220 Encounter for screening for lipoid disorders: Secondary | ICD-10-CM

## 2015-12-13 DIAGNOSIS — Z1329 Encounter for screening for other suspected endocrine disorder: Secondary | ICD-10-CM | POA: Diagnosis not present

## 2015-12-13 DIAGNOSIS — IMO0002 Reserved for concepts with insufficient information to code with codable children: Secondary | ICD-10-CM

## 2015-12-13 DIAGNOSIS — Z0000 Encounter for general adult medical examination without abnormal findings: Secondary | ICD-10-CM | POA: Diagnosis not present

## 2015-12-13 DIAGNOSIS — K219 Gastro-esophageal reflux disease without esophagitis: Secondary | ICD-10-CM | POA: Diagnosis not present

## 2015-12-13 DIAGNOSIS — N979 Female infertility, unspecified: Secondary | ICD-10-CM

## 2015-12-13 DIAGNOSIS — Z131 Encounter for screening for diabetes mellitus: Secondary | ICD-10-CM

## 2015-12-13 LAB — COMPREHENSIVE METABOLIC PANEL
ALT: 20 U/L (ref 0–35)
AST: 16 U/L (ref 0–37)
Albumin: 4.5 g/dL (ref 3.5–5.2)
Alkaline Phosphatase: 61 U/L (ref 39–117)
BILIRUBIN TOTAL: 0.4 mg/dL (ref 0.2–1.2)
BUN: 12 mg/dL (ref 6–23)
CALCIUM: 9.3 mg/dL (ref 8.4–10.5)
CHLORIDE: 107 meq/L (ref 96–112)
CO2: 28 meq/L (ref 19–32)
CREATININE: 0.79 mg/dL (ref 0.40–1.20)
GFR: 89.2 mL/min (ref >60.00)
GLUCOSE: 97 mg/dL (ref 70–99)
Potassium: 4 mEq/L (ref 3.5–5.1)
Sodium: 141 mEq/L (ref 135–145)
Total Protein: 7.3 g/dL (ref 6.0–8.3)

## 2015-12-13 LAB — LIPID PANEL
CHOL/HDL RATIO: 4
Cholesterol: 150 mg/dL (ref 0–200)
HDL: 38.5 mg/dL — AB (ref >39.00)
LDL CALC: 93 mg/dL (ref 0–99)
NONHDL: 111.05
TRIGLYCERIDES: 90 mg/dL (ref 0.0–149.0)
VLDL: 18 mg/dL (ref 0.0–40.0)

## 2015-12-13 LAB — TSH: TSH: 1.42 u[IU]/mL (ref 0.35–4.50)

## 2015-12-13 LAB — HEMOGLOBIN A1C: Hgb A1c MFr Bld: 5.6 % (ref 4.6–6.5)

## 2015-12-13 NOTE — Patient Instructions (Signed)
It was very nice to meet you today!  I hope that you have success in getting pregnant soon- let me know if we can offer any support.  We will test you for H pylori to see if this may be why you are having heartburn.  Assuming this is negative, I would recommend that you use an OTC H2 blocker such as zantac daily for 2 weeks I will be in touch with your labs asap

## 2015-12-13 NOTE — Progress Notes (Signed)
Pre visit review using our clinic tool,if applicable. No additional management support is needed unless otherwise documented below in the visit note.  

## 2015-12-13 NOTE — Progress Notes (Addendum)
Healthcare at [facility] [address], [address] [place], [place] [address] [phone]  Date:  12/13/2015   Name:  Miranda Hall   DOB:  [birth year]   MRN:  [id number]  PCP:  Abbe Amsterdam, MD    Chief Complaint: Establish Care   History of Present Illness:  Miranda Hall is a 35 y.o. very pleasant female patient who presents with the following:  Here today to establish care.  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 She has 2 children (one alive and one deceased), married for nearly 8 years. She is a surgical teach in the OR.  Her living son is nearly 48 yo- he is going into 2nd grade next year.    History of migraine- she did see neurolgoy in the past and her HA is now resolved.  She may get an occasional HSA but it is resolved with ibuprofen.   She is no longer on any medication for depression She does take claritin.   She is also in school- she is getting an advanced degree for surgical tech.    She has her menses today.  She is seeing a fertility specialist and just did a round of provera.  She hopes to be pregnant again soon Sadly she lost a daughter at 48 weeks in 2013  She has noted some indigestion- she uses tums for this. It is much worse with lying flat, occurs most days over the last month.  She has noted this for about a month. Worse with certain foods such as spicy foods, water.   She does not have any CP or pain with exercise or exertion.   She is a non smoker, does not drink alcohol, she does exercise   Patient Active Problem List   Diagnosis Date Noted  . IUFD (intrauterine fetal death) 2012/02/07  . Status post repeat low transverse cesarean section 02-07-12  . Breech presentation 12/31/2011  . Abnormal GTT (glucose tolerance test) 12/31/2011  . Depression complicating pregnancy, antepartum 12/31/2011  . H/O postpartum depression, currently pregnant 12/03/2011  . Migraine headache without aura 12/03/2011  . H/O: cesarean  section 12/02/2011    Past Medical History  Diagnosis Date  . H/O candidiasis   . H/O varicella   . Anemia     during pregnancy  . H/O postpartum depression, currently pregnant   . Hx of migraines   . Depression   . Anxiety   . Headache     Migraines  . PONV (postoperative nausea and vomiting)   . Allergy   . GERD (gastroesophageal reflux disease)     Past Surgical History  Procedure Laterality Date  . Cesarean section  2010  . Wisdom tooth extraction      age 88  . Cesarean section  02-07-12    Procedure: CESAREAN SECTION;  Surgeon: Michael Litter, MD;  Location: [facility];  Service: Gynecology;  Laterality: N/A;  Repeat Cesarean Section  . Laparoscopy N/A 07/26/2015    Procedure: LAPAROSCOPY OPERATIVE;  Surgeon: Lavina Hamman, MD;  Location: [facility];  Service: Gynecology;  Laterality: N/A;  REQUEST CASE FOR STACY RN, MELINDA CRNA, DEE CST  . Lysis of adhesion N/A 07/26/2015    Procedure: LYSIS OF ADHESION;  Surgeon: Lavina Hamman, MD;  Location: [facility];  Service: Gynecology;  Laterality: N/A;  . Chromopertubation N/A 07/26/2015    Procedure: CHROMOPERTUBATION;  Surgeon: Lavina Hamman, MD;  Location: [facility];  Service: Gynecology;  Laterality: N/A;  Social History  Substance Use Topics  . Smoking status: Never Smoker   . Smokeless tobacco: Never Used  . Alcohol Use: No    Family History  Problem Relation Age of Onset  . Depression Sister   . Kidney disease Maternal Aunt   . Cancer Maternal Grandmother   . Cancer Maternal Grandfather   . Heart disease Paternal Grandmother   . Hypertension Paternal Grandmother   . Drug abuse Paternal Grandmother   . Stroke Paternal Grandmother   . Cancer Paternal Grandmother   . Heart disease Paternal Grandfather   . Hypertension Paternal Grandfather   . Drug abuse Paternal Grandfather   . Stroke Paternal Grandfather   . Cancer Paternal Grandfather     Allergies  Allergen Reactions  . Food Anaphylaxis    Peanut allergy  .  Imitrex [Sumatriptan] Shortness Of Breath    Feels like weight on chest  . Milk-Related Compounds Anaphylaxis    Medication list has been reviewed and updated.  Current Outpatient Prescriptions on File Prior to Visit  Medication Sig Dispense Refill  . loratadine (CLARITIN) 10 MG tablet Take 10 mg by mouth daily.    Marland Kitchen OVER THE COUNTER MEDICATION Take 1 tablet by mouth daily. Reported on 12/13/2015    . OVER THE COUNTER MEDICATION Take 1 tablet by mouth daily. Reported on 12/13/2015     No current facility-administered medications on file prior to visit.    Review of Systems:  As per HPI- otherwise negative.   Physical Examination: Filed Vitals:   12/13/15 0823  BP: 112/80  Pulse: 63  Temp: 98.1 F (36.7 C)   Filed Vitals:   12/13/15 0823  Height:  (1.575 m)  Weight: 180 lb (81.647 kg)   Body mass index is 32.91 kg/(m^2). Ideal Body Weight: Weight in (lb) to have BMI = 25: 136.4  GEN: WDWN, NAD, Non-toxic, A & O x 3, overweight, looks well HEENT: Atraumatic, Normocephalic. Neck supple. No masses, No LAD.  Bilateral TM wnl, oropharynx normal.  PEERL,EOMI.   Ears and Nose: No external deformity. CV: RRR, No M/G/R. No JVD. No thrill. No extra heart sounds. PULM: CTA B, no wheezes, crackles, rhonchi. No retractions. No resp. distress. No accessory muscle use. ABD: S, ND, +BS. No rebound. No HSM.  Minimal epigastric TTP EXTR: No c/c/e NEURO Normal gait.  PSYCH: Normally interactive. Conversant. Not depressed or anxious appearing.  Calm demeanor.    Assessment and Plan: Physical exam  Gastroesophageal reflux disease, esophagitis presence not specified - Plan: H. pylori breath test  Screening for hyperlipidemia - Plan: Lipid panel  Screening for diabetes mellitus - Plan: Comprehensive metabolic panel, Hemoglobin A1c  Screening for thyroid disorder - Plan: TSH  IUFD (intrauterine fetal death)  Secondary female infertility  Here today to establish  care Screening labs as above Will do an h pylori for her today as she has noted new onset of GERD over a month or so  it was very nice to meet you today!  I hope that you have success in getting pregnant soon- let me know if we can offer any support.  We will test you for H pylori to see if this may be why you are having heartburn.  Assuming this is negative, I would recommend that you use an OTC H2 blocker such as zantac daily for 2 weeks I will be in touch with your labs asap  Signed Abbe Amsterdam, MD  Labs 4/22  Results for orders placed or performed  in visit on 12/13/15  Comprehensive metabolic panel  Result Value Ref Range   Sodium 141 135 - 145 mEq/L   Potassium 4.0 3.5 - 5.1 mEq/L   Chloride 107 96 - 112 mEq/L   CO2 28 19 - 32 mEq/L   Glucose, Bld 97 70 - 99 mg/dL   BUN 12 6 - 23 mg/dL   Creatinine, Ser 1.610.79 0.40 - 1.20 mg/dL   Total Bilirubin 0.4 0.2 - 1.2 mg/dL   Alkaline Phosphatase 61 39 - 117 U/L   AST 16 0 - 37 U/L   ALT 20 0 - 35 U/L   Total Protein 7.3 6.0 - 8.3 g/dL   Albumin 4.5 3.5 - 5.2 g/dL   Calcium 9.3 8.4 - 09.610.5 mg/dL   GFR 04.5489.20 >09.81>60.00 mL/min  Lipid panel  Result Value Ref Range   Cholesterol 150 0 - 200 mg/dL   Triglycerides 19.190.0 0.0 - 149.0 mg/dL   HDL 47.8238.50 (L) >95.62>39.00 mg/dL   VLDL 13.018.0 0.0 - 86.540.0 mg/dL   LDL Cholesterol 93 0 - 99 mg/dL   Total CHOL/HDL Ratio 4    NonHDL 111.05   TSH  Result Value Ref Range   TSH 1.42 0.35 - 4.50 uIU/mL  Hemoglobin A1c  Result Value Ref Range   Hgb A1c MFr Bld 5.6 4.6 - 6.5 %  H. pylori breath test  Result Value Ref Range   H. pylori Breath Test NOT DETECTED Not Detected

## 2015-12-14 DIAGNOSIS — E288 Other ovarian dysfunction: Secondary | ICD-10-CM | POA: Diagnosis not present

## 2015-12-14 DIAGNOSIS — Z319 Encounter for procreative management, unspecified: Secondary | ICD-10-CM | POA: Diagnosis not present

## 2015-12-14 LAB — H. PYLORI BREATH TEST: H. pylori Breath Test: NOT DETECTED

## 2015-12-14 MED FILL — MENOPUR 75 UNIT VIAL: 75 | 10 days supply | Qty: 10 | Fill #0

## 2015-12-14 MED FILL — PREGNYL 10,000 UNITS VIAL: 10000 | 10 days supply | Qty: 1 | Fill #0

## 2015-12-14 MED FILL — LETROZOLE 2.5 MG TABLET: 2.5 | 5 days supply | Qty: 15 | Fill #1

## 2015-12-15 ENCOUNTER — Encounter: Payer: Self-pay | Admitting: Family Medicine

## 2015-12-15 DIAGNOSIS — H5213 Myopia, bilateral: Secondary | ICD-10-CM | POA: Diagnosis not present

## 2015-12-20 DIAGNOSIS — E288 Other ovarian dysfunction: Secondary | ICD-10-CM | POA: Diagnosis not present

## 2015-12-24 DIAGNOSIS — Z319 Encounter for procreative management, unspecified: Secondary | ICD-10-CM | POA: Diagnosis not present

## 2015-12-24 DIAGNOSIS — E288 Other ovarian dysfunction: Secondary | ICD-10-CM | POA: Diagnosis not present

## 2015-12-26 DIAGNOSIS — Z319 Encounter for procreative management, unspecified: Secondary | ICD-10-CM | POA: Diagnosis not present

## 2015-12-26 DIAGNOSIS — E288 Other ovarian dysfunction: Secondary | ICD-10-CM | POA: Diagnosis not present

## 2015-12-27 MED FILL — MENOPUR 75 UNIT VIAL: 75 | 10 days supply | Qty: 10 | Fill #1

## 2015-12-28 DIAGNOSIS — E288 Other ovarian dysfunction: Secondary | ICD-10-CM | POA: Diagnosis not present

## 2015-12-29 DIAGNOSIS — Z319 Encounter for procreative management, unspecified: Secondary | ICD-10-CM | POA: Diagnosis not present

## 2015-12-29 DIAGNOSIS — E288 Other ovarian dysfunction: Secondary | ICD-10-CM | POA: Diagnosis not present

## 2016-03-18 MED FILL — BUTALB-ACETAMIN-CAFF 50-325: 50-325-40 | 5 days supply | Qty: 20 | Fill #0

## 2016-04-10 MED FILL — AMOX-CLAV 875-125 MG TABLET: 875-125 | 7 days supply | Qty: 14 | Fill #0

## 2016-05-01 ENCOUNTER — Encounter: Payer: Self-pay | Admitting: Family Medicine

## 2016-05-01 ENCOUNTER — Ambulatory Visit (INDEPENDENT_AMBULATORY_CARE_PROVIDER_SITE_OTHER): Payer: 59 | Admitting: Family Medicine

## 2016-05-01 VITALS — BP 127/85 | HR 85 | Temp 97.9°F | Ht 62.0 in | Wt 171.2 lb

## 2016-05-01 DIAGNOSIS — J0110 Acute frontal sinusitis, unspecified: Secondary | ICD-10-CM

## 2016-05-01 DIAGNOSIS — N926 Irregular menstruation, unspecified: Secondary | ICD-10-CM | POA: Diagnosis not present

## 2016-05-01 LAB — POCT URINE PREGNANCY: PREG TEST UR: NEGATIVE

## 2016-05-01 MED ORDER — AMOXICILLIN 500 MG PO CAPS
1000.0000 mg | ORAL_CAPSULE | Freq: Two times a day (BID) | ORAL | 0 refills | Status: DC
Start: 2016-05-01 — End: 2016-05-14

## 2016-05-01 MED FILL — AMOXICILLIN 500 MG CAPSULE: 500 | 10 days supply | Qty: 40 | Fill #0

## 2016-05-01 NOTE — Progress Notes (Signed)
[facility] [address], [address] [place], [place] [phone]  Date:  05/01/2016   Name:  Miranda Hall   DOB:  12/30/1980   MRN:  [id number]  PCP:  Abbe AmsterdamOPLAND,Amariss Detamore, MD    Chief Complaint: Sinusitis (c/o poss sinus infection. pt states that she has yellow to light green mucus when blowing nose, dry cough with occ mucus, bilateral ear pressure x 2 weeks worse past 2 days. )   History of Present Illness:  Miranda Hall is a 35 y.o. very pleasant female patient who presents with the following:  She is here today with sx of a sinus infection for about 2 weeks. She was exposed to a lot of people with a recent funeral in the family and also has been tired in dealing with the death of her grandfather  She thinks that her sx started with yellow mucus from her nose. She used OTC medications including sudafed, mucinex.  Her sx improved some, but them came back.   She has a dry throat, sinus congestion/ pressure/ pain, ears feel "like pressure," she is blowing discolored mucus from her nose.  mildHA She has a dry cough.  No fever, no rash. No GI symptoms- no nausea or vomiting   She has struggled with infertility and noted irregula rmenses.  She does not think she is pregnant but is ok to do a test today  Patient Active Problem List   Diagnosis Date Noted  . IUFD (intrauterine fetal death) 01/13/2012  . Status post repeat low transverse cesarean section 01/13/2012  . Breech presentation 12/31/2011  . Abnormal GTT (glucose tolerance test) 12/31/2011  . Depression complicating pregnancy, antepartum 12/31/2011  . H/O postpartum depression, currently pregnant 12/03/2011  . Migraine headache without aura 12/03/2011  . H/O: cesarean section 12/02/2011    Past Medical History:  Diagnosis Date  . Allergy   . Anemia    during pregnancy  . Anxiety   . Depression   . GERD (gastroesophageal reflux disease)   . H/O  candidiasis   . H/O postpartum depression, currently pregnant   . H/O varicella   . Headache    Migraines  . Hx of migraines   . PONV (postoperative nausea and vomiting)     Past Surgical History:  Procedure Laterality Date  . CESAREAN SECTION  2010  . CESAREAN SECTION  01/13/2012   Procedure: CESAREAN SECTION;  Surgeon: Michael LitterNaima A Dillard, MD;  Location: [facility];  Service: Gynecology;  Laterality: N/A;  Repeat Cesarean Section  . CHROMOPERTUBATION N/A 07/26/2015   Procedure: CHROMOPERTUBATION;  Surgeon: Lavina Hammanodd Meisinger, MD;  Location: [facility];  Service: Gynecology;  Laterality: N/A;  . LAPAROSCOPY N/A 07/26/2015   Procedure: LAPAROSCOPY OPERATIVE;  Surgeon: Lavina Hammanodd Meisinger, MD;  Location: [facility];  Service: Gynecology;  Laterality: N/A;  REQUEST CASE FOR STACY RN, MELINDA CRNA, DEE CST  . LYSIS OF ADHESION N/A 07/26/2015   Procedure: LYSIS OF ADHESION;  Surgeon: Lavina Hammanodd Meisinger, MD;  Location: [facility];  Service: Gynecology;  Laterality: N/A;  . WISDOM TOOTH EXTRACTION     age 89    Social History  Substance Use Topics  . Smoking status: Never Smoker  . Smokeless tobacco: Never Used  . Alcohol use No    Family History  Problem Relation Age of Onset  . Depression Sister   . Kidney disease Maternal Aunt   . Cancer Maternal Grandmother   . Cancer Maternal Grandfather   .  Heart disease Paternal Grandmother   . Hypertension Paternal Grandmother   . Drug abuse Paternal Grandmother   . Stroke Paternal Grandmother   . Cancer Paternal Grandmother   . Heart disease Paternal Grandfather   . Hypertension Paternal Grandfather   . Drug abuse Paternal Grandfather   . Stroke Paternal Grandfather   . Cancer Paternal Grandfather     Allergies  Allergen Reactions  . Food Anaphylaxis    Peanut allergy  . Imitrex [Sumatriptan] Shortness Of Breath    Feels like weight on chest  . Milk-Related Compounds Anaphylaxis    Medication list has been reviewed and updated.  Current Outpatient  Prescriptions on File Prior to Visit  Medication Sig Dispense Refill  . loratadine (CLARITIN) 10 MG tablet Take 10 mg by mouth daily.    Marland Kitchen OVER THE COUNTER MEDICATION Take 1 tablet by mouth daily. Reported on 12/13/2015    . OVER THE COUNTER MEDICATION Take 1 tablet by mouth daily. Reported on 12/13/2015     No current facility-administered medications on file prior to visit.     Review of Systems:  As per HPI- otherwise negative.   Physical Examination: Vitals:   05/01/16 0919  BP: 127/85  Pulse: 85  Temp: 97.9 F (36.6 C)   Vitals:   05/01/16 0919  Weight: 171 lb 3.2 oz (77.7 kg)  Height: 5\' 2"  (1.575 m)   Body mass index is 31.31 kg/m. Ideal Body Weight: Weight in (lb) to have BMI = 25: 136.4  GEN: WDWN, NAD, Non-toxic, A & O x 3, looks well HEENT: Atraumatic, Normocephalic. Neck supple. No masses, No LAD. Bilateral TM wnl, oropharynx normal.  PEERL,EOMI.   Nasal cavity is inflamed, mild frontal sinus TTP Ears and Nose: No external deformity. CV: RRR, No M/G/R. No JVD. No thrill. No extra heart sounds. PULM: CTA B, no wheezes, crackles, rhonchi. No retractions. No resp. distress. No accessory muscle use. ABD: S, NT, ND. No rebound. No HSM. EXTR: No c/c/e NEURO Normal gait.  PSYCH: Normally interactive. Conversant. Not depressed or anxious appearing.  Calm demeanor.   Results for orders placed or performed in visit on 05/01/16  POCT urine pregnancy  Result Value Ref Range   Preg Test, Ur Negative Negative     Assessment and Plan: Acute frontal sinusitis, recurrence not specified - Plan: amoxicillin (AMOXIL) 500 MG capsule  Irregular menses - Plan: POCT urine pregnancy  Treat for sinusitis with amoxicillin as above Continue OTC meds as needed HCG is negative as expected.    We will treat you for a sinus infection with amoxicillin- twice daily for 10 days. Continue to use OTC medications as needed.   Please let me know if you are not feeling better over the  next 3-4 days- Sooner if worse.  It was nice to see you today!  Have a great fall season  Signed Abbe Amsterdam, MD

## 2016-05-01 NOTE — Progress Notes (Signed)
Pre visit review using our clinic review tool, if applicable. No additional management support is needed unless otherwise documented below in the visit note. 

## 2016-05-01 NOTE — Patient Instructions (Signed)
We will treat you for a sinus infection with amoxicillin- twice daily for 10 days. Continue to use OTC medications as needed.   Please let me know if you are not feeling better over the next 3-4 days- Sooner if worse.  It was nice to see you today!  Have a great fall season

## 2016-05-06 ENCOUNTER — Telehealth: Payer: Self-pay | Admitting: Family Medicine

## 2016-05-06 NOTE — Telephone Encounter (Signed)
Patient notified of results and verbalized understanding.  

## 2016-05-06 NOTE — Telephone Encounter (Signed)
Relation to WU:JWJXpt:self Call back number:[phone]   Reason for call:  Patient inquiring about lab results taken 05/01/16. Please advise

## 2016-05-14 ENCOUNTER — Ambulatory Visit (INDEPENDENT_AMBULATORY_CARE_PROVIDER_SITE_OTHER): Payer: 59 | Admitting: Family Medicine

## 2016-05-14 ENCOUNTER — Encounter: Payer: Self-pay | Admitting: Family Medicine

## 2016-05-14 VITALS — BP 126/86 | HR 79 | Temp 98.0°F | Ht 64.0 in | Wt 171.8 lb

## 2016-05-14 DIAGNOSIS — Z23 Encounter for immunization: Secondary | ICD-10-CM

## 2016-05-14 DIAGNOSIS — Z111 Encounter for screening for respiratory tuberculosis: Secondary | ICD-10-CM | POA: Diagnosis not present

## 2016-05-14 DIAGNOSIS — R0982 Postnasal drip: Secondary | ICD-10-CM

## 2016-05-14 MED ORDER — IPRATROPIUM BROMIDE 0.03 % NA SOLN: 2.0000 | Freq: Four times a day (QID) | NASAL | 6 refills | Status: DC

## 2016-05-14 MED FILL — IPRATROPIUM 0.03% SPRAY: 0.03 | 43 days supply | Qty: 30 | Fill #0

## 2016-05-14 NOTE — Progress Notes (Signed)
[facility] [address] [place], [place] [address] [phone]  Date:  05/14/2016   Name:  Miranda Hall   DOB:  07-Sep-1980   MRN:  [id number]  PCP:  Abbe Amsterdam, MD    Chief Complaint: Annual Exam (Last PAP: 2016, will schedule wih GYN. Need tetanus and flu vaccine today. Would like rx for Epi-pen.)   History of Present Illness:  Miranda Hall is a 35 y.o. very pleasant female patient who presents with the following:  Seen by myself in April of this year with the following HPI:  Miranda Hall is a 35 y.o. very pleasant female patient who presents with the following: Here today to establish care.  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 She has 2 children (one alive and one deceased), married for nearly 8 years. She is a surgical teach in the OR.  Her living son is nearly 33 yo- he is going into 2nd grade next year.   History of migraine- she did see neurolgoy in the past and her HA is now resolved.  She may get an occasional HSA but it is resolved with ibuprofen.   She is no longer on any medication for depression She does take claritin.   She is also in school- she is getting an advanced degree for surgical tech.   She has her menses today.  She is seeing a fertility specialist and just did a round of provera.  She hopes to be pregnant again soon Sadly she lost a daughter at 74 weeks in 2013 She has noted some indigestion- she uses tums for this. It is much worse with lying flat, occurs most days over the last month.  She has noted this for about a month. Worse with certain foods such as spicy foods, water.   She does not have any CP or pain with exercise or exertion.   She is a non smoker, does not drink alcohol, she does exercise We did labs for her in the spring as well- all looked fine. Her son is in 2nd grade and is doing very well.  She is sadly still not pregnant although she is hopeful.  They are still  trying to ger pregnant naturally and are also looking into adoption.  Would like a flu shot and PPD testing as required by her job.  She has never tested positive on PPD testing She thinks she got her tdap last year but we cannot see this exact date Her stomach sx are much better.   She was treated for a sinus infection a couple of weeks ago- doing well in this regard but she does still have some PND which bothers her at night  LMP 6/17- however we did an HCG for her 2 weeks ago which was negative.  She has irregular menses and is having a lot of trouble becoming pregnant again.   Patient Active Problem List   Diagnosis Date Noted  . IUFD (intrauterine fetal death) January 25, 2012  . Status post repeat low transverse cesarean section 01/25/2012  . Breech presentation 12/31/2011  . Abnormal GTT (glucose tolerance test) 12/31/2011  . Depression complicating pregnancy, antepartum 12/31/2011  . H/O postpartum depression, currently pregnant 12/03/2011  . Migraine headache without aura 12/03/2011  . H/O: cesarean section 12/02/2011    Past Medical History:  Diagnosis Date  . Allergy   . Anemia    during pregnancy  . Anxiety   .  Depression   . GERD (gastroesophageal reflux disease)   . H/O candidiasis   . H/O postpartum depression, currently pregnant   . H/O varicella   . Headache    Migraines  . Hx of migraines   . PONV (postoperative nausea and vomiting)     Past Surgical History:  Procedure Laterality Date  . CESAREAN SECTION  2010  . CESAREAN SECTION  01/13/2012   Procedure: CESAREAN SECTION;  Surgeon: Michael LitterNaima A Dillard, MD;  Location: [facility];  Service: Gynecology;  Laterality: N/A;  Repeat Cesarean Section  . CHROMOPERTUBATION N/A 07/26/2015   Procedure: CHROMOPERTUBATION;  Surgeon: Lavina Hammanodd Meisinger, MD;  Location: [facility];  Service: Gynecology;  Laterality: N/A;  . LAPAROSCOPY N/A 07/26/2015   Procedure: LAPAROSCOPY OPERATIVE;  Surgeon: Lavina Hammanodd Meisinger, MD;  Location: [facility];  Service:  Gynecology;  Laterality: N/A;  REQUEST CASE FOR STACY RN, MELINDA CRNA, DEE CST  . LYSIS OF ADHESION N/A 07/26/2015   Procedure: LYSIS OF ADHESION;  Surgeon: Lavina Hammanodd Meisinger, MD;  Location: [facility];  Service: Gynecology;  Laterality: N/A;  . WISDOM TOOTH EXTRACTION     age 35    Social History  Substance Use Topics  . Smoking status: Never Smoker  . Smokeless tobacco: Never Used  . Alcohol use No    Family History  Problem Relation Age of Onset  . Depression Sister   . Kidney disease Maternal Aunt   . Cancer Maternal Grandmother   . Cancer Maternal Grandfather   . Heart disease Paternal Grandmother   . Hypertension Paternal Grandmother   . Drug abuse Paternal Grandmother   . Stroke Paternal Grandmother   . Cancer Paternal Grandmother   . Heart disease Paternal Grandfather   . Hypertension Paternal Grandfather   . Drug abuse Paternal Grandfather   . Stroke Paternal Grandfather   . Cancer Paternal Grandfather     Allergies  Allergen Reactions  . Food Anaphylaxis    Peanut allergy  . Imitrex [Sumatriptan] Shortness Of Breath    Feels like weight on chest  . Milk-Related Compounds Anaphylaxis    Medication list has been reviewed and updated.  Current Outpatient Prescriptions on File Prior to Visit  Medication Sig Dispense Refill  . loratadine (CLARITIN) 10 MG tablet Take 10 mg by mouth daily.     No current facility-administered medications on file prior to visit.     Review of Systems:  As per HPI- otherwise negative.   Physical Examination: Vitals:   05/14/16 0831  BP: 126/86  Pulse: 79  Temp: 98 F (36.7 C)     Ideal Body Weight:    GEN: WDWN, NAD, Non-toxic, A & O x 3, mild overweight, looks well HEENT: Atraumatic, Normocephalic. Neck supple. No masses, No LAD.  Bilateral TM wnl, oropharynx normal.  PEERL,EOMI.   Ears and Nose: No external deformity. CV: RRR, No M/G/R. No JVD. No thrill. No extra heart sounds. PULM: CTA B, no wheezes, crackles,  rhonchi. No retractions. No resp. distress. No accessory muscle use. ABD: S, NT, ND. No rebound. No HSM. EXTR: No c/c/e NEURO Normal gait.  PSYCH: Normally interactive. Conversant. Not depressed or anxious appearing.  Calm demeanor.    Assessment and Plan: Post-nasal drip - Plan: ipratropium (ATROVENT) 0.03 % nasal spray  Immunization due  Screening for tuberculosis - Plan: PPD  Here today for a PPD and flu shot. She also notes PND which is bothersome to her; we will try atrovent nasal for her as needed She is still hoping  for another child either through pregnancy or adoption.   Suggested the book "Taking Charge of your Fertility" to learn how to track her cycles. We wish her the best of luck in this regard  Signed Abbe Amsterdam, MD

## 2016-05-14 NOTE — Patient Instructions (Signed)
It was very nice to see you today- I am glad that your sinus infection is improved If you like, try the atrovent nasal spray up to 4x daily as needed for drainage.   Please have your TB test read in 48- 72 hours Flu shot given today

## 2016-05-20 DIAGNOSIS — G43709 Chronic migraine without aura, not intractable, without status migrainosus: Secondary | ICD-10-CM | POA: Diagnosis not present

## 2016-05-20 MED FILL — VERAPAMIL ER 120 MG TABLET: 120 | 30 days supply | Qty: 30 | Fill #0

## 2016-05-20 MED FILL — ZOLMitriptan 5 MG TBDP: 5 | 30 days supply | Qty: 9 | Fill #0

## 2016-07-01 MED FILL — VERAPAMIL ER 120 MG TABLET: 120 | 30 days supply | Qty: 30 | Fill #1

## 2016-07-09 DIAGNOSIS — F411 Generalized anxiety disorder: Secondary | ICD-10-CM | POA: Diagnosis not present

## 2016-07-09 DIAGNOSIS — Z130 Encounter for screening for diseases of the blood and blood-forming organs and certain disorders involving the immune mechanism: Secondary | ICD-10-CM | POA: Diagnosis not present

## 2016-07-09 DIAGNOSIS — N978 Female infertility of other origin: Secondary | ICD-10-CM | POA: Diagnosis not present

## 2016-07-09 DIAGNOSIS — N925 Other specified irregular menstruation: Secondary | ICD-10-CM | POA: Diagnosis not present

## 2016-07-09 DIAGNOSIS — Z6831 Body mass index (BMI) 31.0-31.9, adult: Secondary | ICD-10-CM | POA: Diagnosis not present

## 2016-07-09 DIAGNOSIS — Z01419 Encounter for gynecological examination (general) (routine) without abnormal findings: Secondary | ICD-10-CM | POA: Diagnosis not present

## 2016-07-09 DIAGNOSIS — N9411 Superficial (introital) dyspareunia: Secondary | ICD-10-CM | POA: Diagnosis not present

## 2016-07-09 DIAGNOSIS — Z1389 Encounter for screening for other disorder: Secondary | ICD-10-CM | POA: Diagnosis not present

## 2016-07-09 MED FILL — SERTRALINE HCL 50 MG TABLET: 50 | 30 days supply | Qty: 30 | Fill #0

## 2016-07-09 MED FILL — metFORMIN HCL 500 MG TABS: 500 | 30 days supply | Qty: 90 | Fill #0

## 2016-08-06 MED FILL — VERAPAMIL ER 120 MG TABLET: 120 | 30 days supply | Qty: 30 | Fill #2

## 2016-08-06 MED FILL — SERTRALINE HCL 50 MG TABLET: 50 | 30 days supply | Qty: 30 | Fill #1

## 2016-08-15 MED FILL — metFORMIN HCL 850 MG TABS: 850 | 30 days supply | Qty: 60 | Fill #0

## 2016-09-09 MED FILL — CLOBETASOL 0.05% CREAM: 0.05 | 20 days supply | Qty: 60 | Fill #0

## 2016-09-15 MED FILL — metFORMIN HCL 850 MG TABS: 850 | 30 days supply | Qty: 60 | Fill #0

## 2016-09-17 MED FILL — SERTRALINE HCL 50 MG TABLET: 50 | 30 days supply | Qty: 30 | Fill #2

## 2016-10-17 MED FILL — SERTRALINE HCL 50 MG TABLET: 50 | 30 days supply | Qty: 30 | Fill #3

## 2016-11-17 MED FILL — VERAPAMIL ER 120 MG TABLET: 120 | 30 days supply | Qty: 30 | Fill #3

## 2016-11-17 MED FILL — SERTRALINE HCL 50 MG TABLET: 50 | 30 days supply | Qty: 30 | Fill #4

## 2016-12-24 MED FILL — SERTRALINE HCL 50 MG TABLET: 50 | 30 days supply | Qty: 30 | Fill #5

## 2016-12-24 MED FILL — VERAPAMIL ER 120 MG TABLET: 120 | 30 days supply | Qty: 30 | Fill #4

## 2017-01-29 MED FILL — SERTRALINE HCL 50 MG TABLET: 50 | 30 days supply | Qty: 30 | Fill #6

## 2017-01-29 MED FILL — VERAPAMIL ER 120 MG TABLET: 120 | 30 days supply | Qty: 30 | Fill #5

## 2017-03-02 MED FILL — VERAPAMIL ER 120 MG TABLET: 120 | 30 days supply | Qty: 30 | Fill #6

## 2017-03-02 MED FILL — SERTRALINE HCL 50 MG TABLET: 50 | 30 days supply | Qty: 30 | Fill #7

## 2017-04-01 MED FILL — VERAPAMIL ER 120 MG TABLET: 120 | 30 days supply | Qty: 30 | Fill #7

## 2017-04-02 MED FILL — SERTRALINE HCL 50 MG TABLET: 50 | 30 days supply | Qty: 30 | Fill #8

## 2017-05-06 MED FILL — VERAPAMIL ER 120 MG TABLET: 120 | 30 days supply | Qty: 30 | Fill #8

## 2017-05-06 MED FILL — SERTRALINE HCL 50 MG TABLET: 50 | 30 days supply | Qty: 30 | Fill #9

## 2017-06-08 MED FILL — SERTRALINE HCL 50 MG TABLET: 50 | 30 days supply | Qty: 30 | Fill #10

## 2017-06-09 MED FILL — VERAPAMIL ER 120 MG TABLET: 120 | 30 days supply | Qty: 30 | Fill #0

## 2017-07-06 MED FILL — AMOXICILLIN 500 MG CAPSULE: 500 | 10 days supply | Qty: 60 | Fill #0

## 2017-07-10 MED FILL — SERTRALINE HCL 50 MG TABLET: 50 | 30 days supply | Qty: 30 | Fill #0

## 2017-07-10 MED FILL — VERAPAMIL ER 120 MG TABLET: 120 | 30 days supply | Qty: 30 | Fill #1

## 2017-08-12 MED FILL — VERAPAMIL ER 120 MG TABLET: 120 | 30 days supply | Qty: 30 | Fill #2

## 2017-08-13 DIAGNOSIS — Z124 Encounter for screening for malignant neoplasm of cervix: Secondary | ICD-10-CM | POA: Diagnosis not present

## 2017-08-13 DIAGNOSIS — Z01419 Encounter for gynecological examination (general) (routine) without abnormal findings: Secondary | ICD-10-CM | POA: Diagnosis not present

## 2017-08-13 DIAGNOSIS — N978 Female infertility of other origin: Secondary | ICD-10-CM | POA: Diagnosis not present

## 2017-08-13 DIAGNOSIS — F411 Generalized anxiety disorder: Secondary | ICD-10-CM | POA: Diagnosis not present

## 2017-08-13 DIAGNOSIS — Z1151 Encounter for screening for human papillomavirus (HPV): Secondary | ICD-10-CM | POA: Diagnosis not present

## 2017-08-13 DIAGNOSIS — Z130 Encounter for screening for diseases of the blood and blood-forming organs and certain disorders involving the immune mechanism: Secondary | ICD-10-CM | POA: Diagnosis not present

## 2017-08-13 DIAGNOSIS — N9411 Superficial (introital) dyspareunia: Secondary | ICD-10-CM | POA: Diagnosis not present

## 2017-08-13 DIAGNOSIS — Z1389 Encounter for screening for other disorder: Secondary | ICD-10-CM | POA: Diagnosis not present

## 2017-08-13 DIAGNOSIS — N925 Other specified irregular menstruation: Secondary | ICD-10-CM | POA: Diagnosis not present

## 2017-08-13 MED FILL — SERTRALINE HCL 50 MG TABLET: 50 | 30 days supply | Qty: 30 | Fill #0

## 2017-09-08 NOTE — Progress Notes (Signed)
[facility] [address] [place], [place] [address] [phone]  Date:  09/10/2017   Name:  Miranda Hall   DOB:  1981-03-09   MRN:  [id number]  PCP:  Pearline Cables, MD    Chief Complaint: Sinusitis (dx with sinus infection about 1 month ago, given amoxicillin, completed course but no relief. )   History of Present Illness:  Miranda Hall is a 37 y.o. very pleasant female patient who presents with the following:  Generally healthy young woman here today with concern of possible sinus infection  She has been feeling ill for a little over a month with sinus congestion Started with a scratchy throat, used some OTC meds which do help some  Her sx are now mostly in her sinuses Her teeth are hurting some  She has noted some cough No fever No GI symptoms   She was treated with amoxicillin for about 10 days by her OBG- she finished this about 2 weeks ago but it did not seem to resolve her sx   She is not pregnant currently  Accompanied today by her 87 yo son Patient Active Problem List   Diagnosis Date Noted  . IUFD (intrauterine fetal death) January 27, 2012  . Status post repeat low transverse cesarean section January 27, 2012  . Breech presentation 12/31/2011  . Abnormal GTT (glucose tolerance test) 12/31/2011  . Depression complicating pregnancy, antepartum 12/31/2011  . H/O postpartum depression, currently pregnant 12/03/2011  . Migraine headache without aura 12/03/2011  . H/O: cesarean section 12/02/2011    Past Medical History:  Diagnosis Date  . Allergy   . Anemia    during pregnancy  . Anxiety   . Depression   . GERD (gastroesophageal reflux disease)   . H/O candidiasis   . H/O postpartum depression, currently pregnant   . H/O varicella   . Headache    Migraines  . Hx of migraines   . PONV (postoperative nausea and vomiting)     Past Surgical History:  Procedure Laterality Date  . CESAREAN  SECTION  2010  . CESAREAN SECTION  01/27/12   Procedure: CESAREAN SECTION;  Surgeon: Michael Litter, MD;  Location: [facility];  Service: Gynecology;  Laterality: N/A;  Repeat Cesarean Section  . CHROMOPERTUBATION N/A 07/26/2015   Procedure: CHROMOPERTUBATION;  Surgeon: Lavina Hamman, MD;  Location: [facility];  Service: Gynecology;  Laterality: N/A;  . LAPAROSCOPY N/A 07/26/2015   Procedure: LAPAROSCOPY OPERATIVE;  Surgeon: Lavina Hamman, MD;  Location: [facility];  Service: Gynecology;  Laterality: N/A;  REQUEST CASE FOR STACY RN, MELINDA CRNA, DEE CST  . LYSIS OF ADHESION N/A 07/26/2015   Procedure: LYSIS OF ADHESION;  Surgeon: Lavina Hamman, MD;  Location: [facility];  Service: Gynecology;  Laterality: N/A;  . WISDOM TOOTH EXTRACTION     age 81    Social History   Tobacco Use  . Smoking status: Never Smoker  . Smokeless tobacco: Never Used  Substance Use Topics  . Alcohol use: No    Alcohol/week: 0.0 oz  . Drug use: No    Family History  Problem Relation Age of Onset  . Depression Sister   . Kidney disease Maternal Aunt   . Cancer Maternal Grandmother   . Cancer Maternal Grandfather   . Heart disease Paternal Grandmother   . Hypertension Paternal Grandmother   . Drug abuse Paternal Grandmother   . Stroke Paternal Grandmother   . Cancer Paternal  Grandmother   . Heart disease Paternal Grandfather   . Hypertension Paternal Grandfather   . Drug abuse Paternal Grandfather   . Stroke Paternal Grandfather   . Cancer Paternal Grandfather     Allergies  Allergen Reactions  . Food Anaphylaxis    Peanut allergy  . Imitrex [Sumatriptan] Shortness Of Breath    Feels like weight on chest  . Milk-Related Compounds Anaphylaxis    Medication list has been reviewed and updated.  Current Outpatient Medications on File Prior to Visit  Medication Sig Dispense Refill  . Ascorbic Acid (VITAMIN C) 1000 MG tablet Take 1,000 mg by mouth daily.    Marland Kitchen. ipratropium (ATROVENT) 0.03 % nasal spray Place  2 sprays into the nose 4 (four) times daily. 30 mL 6  . loratadine (CLARITIN) 10 MG tablet Take 10 mg by mouth daily.     No current facility-administered medications on file prior to visit.     Review of Systems:  As per HPI- otherwise negative.   Physical Examination: Vitals:   09/10/17 1600  BP: 122/80  Pulse: 75  Temp: 99 F (37.2 C)  SpO2: 98%   Vitals:   09/10/17 1600  Weight: 178 lb 6.4 oz (80.9 kg)  Height: 5\' 2"  (1.575 m)   Body mass index is 32.63 kg/m. Ideal Body Weight: Weight in (lb) to have BMI = 25: 136.4  GEN: WDWN, NAD, Non-toxic, A & O x 3, overweight, looks well HEENT: Atraumatic, Normocephalic. Neck supple. No masses, No LAD.  Bilateral TM wnl, oropharynx normal.  PEERL,EOMI.  Sinus tenderness and nasal congestion on exam Ears and Nose: No external deformity. CV: RRR, No M/G/R. No JVD. No thrill. No extra heart sounds. PULM: CTA B, no wheezes, crackles, rhonchi. No retractions. No resp. distress. No accessory muscle use. EXTR: No c/c/e NEURO Normal gait.  PSYCH: Normally interactive. Conversant. Not depressed or anxious appearing.  Calm demeanor.    Assessment and Plan: Acute recurrent frontal sinusitis - Plan: cefdinir (OMNICEF) 300 MG capsule  Here today for a persistent sinus infection Treated with amoxicillin (unsure dose) but sx did not remit Will treat with omnicef- she will use a probiotic and let me know if not feeling better soon  Signed Abbe AmsterdamJessica Tyeasha Ebbs, MD

## 2017-09-10 ENCOUNTER — Ambulatory Visit: Payer: 59 | Admitting: Family Medicine

## 2017-09-10 VITALS — BP 122/80 | HR 75 | Temp 99.0°F | Ht 62.0 in | Wt 178.4 lb

## 2017-09-10 DIAGNOSIS — J0111 Acute recurrent frontal sinusitis: Secondary | ICD-10-CM

## 2017-09-10 MED ORDER — CEFDINIR 300 MG PO CAPS: 300.0000 mg | ORAL_CAPSULE | Freq: Two times a day (BID) | ORAL | 0 refills | Status: DC

## 2017-09-10 MED FILL — CEFDINIR 300 MG CAPSULE: 300 | 10 days supply | Qty: 20 | Fill #0

## 2017-09-10 MED FILL — SERTRALINE HCL 50 MG TABLET: 50 | 30 days supply | Qty: 30 | Fill #1

## 2017-09-10 MED FILL — VERAPAMIL ER 120 MG TABLET: 120 | 30 days supply | Qty: 30 | Fill #3

## 2017-09-10 NOTE — Patient Instructions (Signed)
It was good to see you today- take care and let me know if you are not better soon We will treat you with omnicef for 10 days

## 2017-09-18 ENCOUNTER — Encounter: Payer: Self-pay | Admitting: Family Medicine

## 2017-09-18 ENCOUNTER — Ambulatory Visit: Payer: 59 | Admitting: Family Medicine

## 2017-09-18 VITALS — BP 128/70 | HR 60 | Temp 98.0°F | Resp 16 | Ht 62.0 in | Wt 180.6 lb

## 2017-09-18 DIAGNOSIS — J0100 Acute maxillary sinusitis, unspecified: Secondary | ICD-10-CM | POA: Diagnosis not present

## 2017-09-18 MED ORDER — LEVOFLOXACIN 500 MG PO TABS: 500.0000 mg | ORAL_TABLET | Freq: Every day | ORAL | 0 refills | Status: DC

## 2017-09-18 MED ORDER — METHYLPREDNISOLONE ACETATE 80 MG/ML IJ SUSP
80.0000 mg | Freq: Once | INTRAMUSCULAR | Status: AC
  Administered 2017-09-18: 80 mg via INTRAMUSCULAR

## 2017-09-18 MED ORDER — PREDNISONE 10 MG PO TABS: ORAL_TABLET | ORAL | 0 refills | Status: DC

## 2017-09-18 MED FILL — levoFLOXacin 500 MG TABS: 500 | 7 days supply | Qty: 7 | Fill #0

## 2017-09-18 MED FILL — predniSONE 10 MG TABS: 10 | 12 days supply | Qty: 20 | Fill #0

## 2017-09-18 NOTE — Progress Notes (Signed)
Subjective:  I acted as a Neurosurgeon for Dr. Laury Axon- Meredith Mody, RMA   Patient ID: Miranda Hall, female    DOB: 08/29/1980, 37 y.o.   MRN: [id number]  Chief Complaint  Patient presents with  . Sinusitis    HPI  Patient is in today for sinus issues  Pt has been here 2x --- put on 2 different abx and is still not better.     Patient Care Team: Copland, Gwenlyn Found, MD as PCP - General (Family Medicine)   Past Medical History:  Diagnosis Date  . Allergy   . Anemia    during pregnancy  . Anxiety   . Depression   . GERD (gastroesophageal reflux disease)   . H/O candidiasis   . H/O postpartum depression, currently pregnant   . H/O varicella   . Headache    Migraines  . Hx of migraines   . PONV (postoperative nausea and vomiting)     Past Surgical History:  Procedure Laterality Date  . CESAREAN SECTION  2010  . CESAREAN SECTION  01/13/2012   Procedure: CESAREAN SECTION;  Surgeon: Michael Litter, MD;  Location: [facility];  Service: Gynecology;  Laterality: N/A;  Repeat Cesarean Section  . CHROMOPERTUBATION N/A 07/26/2015   Procedure: CHROMOPERTUBATION;  Surgeon: Lavina Hamman, MD;  Location: [facility];  Service: Gynecology;  Laterality: N/A;  . LAPAROSCOPY N/A 07/26/2015   Procedure: LAPAROSCOPY OPERATIVE;  Surgeon: Lavina Hamman, MD;  Location: [facility];  Service: Gynecology;  Laterality: N/A;  REQUEST CASE FOR STACY RN, MELINDA CRNA, DEE CST  . LYSIS OF ADHESION N/A 07/26/2015   Procedure: LYSIS OF ADHESION;  Surgeon: Lavina Hamman, MD;  Location: [facility];  Service: Gynecology;  Laterality: N/A;  . WISDOM TOOTH EXTRACTION     age 59    Family History  Problem Relation Age of Onset  . Depression Sister   . Kidney disease Maternal Aunt   . Cancer Maternal Grandmother   . Cancer Maternal Grandfather   . Heart disease Paternal Grandmother   . Hypertension Paternal Grandmother   . Drug abuse Paternal Grandmother   . Stroke Paternal Grandmother   . Cancer Paternal Grandmother    . Heart disease Paternal Grandfather   . Hypertension Paternal Grandfather   . Drug abuse Paternal Grandfather   . Stroke Paternal Grandfather   . Cancer Paternal Grandfather     Social History   Socioeconomic History  . Marital status: Married    Spouse name: Not on file  . Number of children: Not on file  . Years of education: Not on file  . Highest education level: Not on file  Social Needs  . Financial resource strain: Not on file  . Food insecurity - worry: Not on file  . Food insecurity - inability: Not on file  . Transportation needs - medical: Not on file  . Transportation needs - non-medical: Not on file  Occupational History  . Not on file  Tobacco Use  . Smoking status: Never Smoker  . Smokeless tobacco: Never Used  Substance and Sexual Activity  . Alcohol use: No    Alcohol/week: 0.0 oz  . Drug use: No  . Sexual activity: Yes    Birth control/protection: None  Other Topics Concern  . Not on file  Social History Narrative  . Not on file    Outpatient Medications Prior to Visit  Medication Sig Dispense Refill  . Ascorbic Acid (VITAMIN C) 1000 MG tablet Take 1,000 mg by  mouth daily.    . cefdinir (OMNICEF) 300 MG capsule Take 1 capsule (300 mg total) by mouth 2 (two) times daily. 20 capsule 0  . ipratropium (ATROVENT) 0.03 % nasal spray Place 2 sprays into the nose 4 (four) times daily. 30 mL 6  . loratadine (CLARITIN) 10 MG tablet Take 10 mg by mouth daily.     No facility-administered medications prior to visit.     Allergies  Allergen Reactions  . Food Anaphylaxis    Peanut allergy  . Imitrex [Sumatriptan] Shortness Of Breath    Feels like weight on chest  . Milk-Related Compounds Anaphylaxis    Review of Systems  Constitutional: Positive for chills. Negative for fever.  HENT: Positive for congestion and sinus pain. Negative for sore throat.   Respiratory: Positive for cough. Negative for sputum production, shortness of breath and wheezing.          Objective:    Physical Exam  Constitutional: She is oriented to person, place, and time. She appears well-developed and well-nourished.  HENT:  Right Ear: External ear normal.  Left Ear: External ear normal.  Nose: Right sinus exhibits maxillary sinus tenderness. Left sinus exhibits maxillary sinus tenderness.  + PND + errythema  Eyes: Conjunctivae are normal. Right eye exhibits no discharge. Left eye exhibits no discharge.  Cardiovascular: Normal rate, regular rhythm and normal heart sounds.  No murmur heard. Pulmonary/Chest: Effort normal and breath sounds normal. No respiratory distress. She has no wheezes. She has no rales. She exhibits no tenderness.  Musculoskeletal: She exhibits no edema.  Lymphadenopathy:    She has cervical adenopathy.  Neurological: She is alert and oriented to person, place, and time.  Nursing note and vitals reviewed.   BP 128/70 (BP Location: Left Arm, Patient Position: Sitting)   Pulse 60   Temp 98 F (36.7 C) (Oral)   Resp 16   Ht 5\' 2"  (1.575 m)   Wt 180 lb 9.6 oz (81.9 kg)   SpO2 97%   BMI 33.03 kg/m  Wt Readings from Last 3 Encounters:  09/18/17 180 lb 9.6 oz (81.9 kg)  09/10/17 178 lb 6.4 oz (80.9 kg)  05/14/16 171 lb 12.8 oz (77.9 kg)   BP Readings from Last 3 Encounters:  09/18/17 128/70  09/10/17 122/80  05/14/16 126/86     Immunization History  Administered Date(s) Administered  . Influenza Split 08/04/2011  . Influenza,inj,Quad PF,6+ Mos 05/14/2016  . PPD Test 05/14/2016    Health Maintenance  Topic Date Due  . HIV Screening  02/28/1998  . TETANUS/TDAP  02/28/2002  . PAP SMEAR  04/25/2018  . INFLUENZA VACCINE  Completed    Lab Results  Component Value Date   WBC 6.2 07/26/2015   HGB 13.5 07/26/2015   HCT 40.3 07/26/2015   PLT 228 07/26/2015   GLUCOSE 97 12/13/2015   CHOL 150 12/13/2015   TRIG 90.0 12/13/2015   HDL 38.50 (L) 12/13/2015   LDLCALC 93 12/13/2015   ALT 20 12/13/2015   AST 16 12/13/2015    NA 141 12/13/2015   K 4.0 12/13/2015   CL 107 12/13/2015   CREATININE 0.79 12/13/2015   BUN 12 12/13/2015   CO2 28 12/13/2015   TSH 1.42 12/13/2015   HGBA1C 5.6 12/13/2015    Lab Results  Component Value Date   TSH 1.42 12/13/2015   Lab Results  Component Value Date   WBC 6.2 07/26/2015   HGB 13.5 07/26/2015   HCT 40.3 07/26/2015   MCV 88.0  07/26/2015   PLT 228 07/26/2015   Lab Results  Component Value Date   NA 141 12/13/2015   K 4.0 12/13/2015   CO2 28 12/13/2015   GLUCOSE 97 12/13/2015   BUN 12 12/13/2015   CREATININE 0.79 12/13/2015   BILITOT 0.4 12/13/2015   ALKPHOS 61 12/13/2015   AST 16 12/13/2015   ALT 20 12/13/2015   PROT 7.3 12/13/2015   ALBUMIN 4.5 12/13/2015   CALCIUM 9.3 12/13/2015   GFR 89.20 12/13/2015   Lab Results  Component Value Date   CHOL 150 12/13/2015   Lab Results  Component Value Date   HDL 38.50 (L) 12/13/2015   Lab Results  Component Value Date   LDLCALC 93 12/13/2015   Lab Results  Component Value Date   TRIG 90.0 12/13/2015   Lab Results  Component Value Date   CHOLHDL 4 12/13/2015   Lab Results  Component Value Date   HGBA1C 5.6 12/13/2015         Assessment & Plan:   Problem List Items Addressed This Visit    None    Visit Diagnoses    Acute maxillary sinusitis, recurrence not specified    -  Primary   Relevant Medications   predniSONE (DELTASONE) 10 MG tablet   levofloxacin (LEVAQUIN) 500 MG tablet   methylPREDNISolone acetate (DEPO-MEDROL) injection 80 mg (Completed)    con't antihistamine and atrovent nasal spray rto prn   I am having Miranda Hall start on predniSONE and levofloxacin. I am also having her maintain her loratadine, ipratropium, vitamin C, and cefdinir. We administered methylPREDNISolone acetate.  Meds ordered this encounter  Medications  . predniSONE (DELTASONE) 10 MG tablet    Sig: TAKE 3 TABLETS PO QD FOR 3 DAYS THEN TAKE 2 TABLETS PO QD FOR 3 DAYS THEN TAKE 1 TABLET PO  QD FOR 3 DAYS THEN TAKE 1/2 TAB PO QD FOR 3 DAYS    Dispense:  20 tablet    Refill:  0  . levofloxacin (LEVAQUIN) 500 MG tablet    Sig: Take 1 tablet (500 mg total) by mouth daily.    Dispense:  7 tablet    Refill:  0  . methylPREDNISolone acetate (DEPO-MEDROL) injection 80 mg    CMA served as scribe during this visit. History, Physical and Plan performed by medical provider. Documentation and orders reviewed and attested to.  Donato SchultzYvonne R Lowne Chase, DO

## 2017-09-18 NOTE — Patient Instructions (Signed)

## 2017-09-24 ENCOUNTER — Other Ambulatory Visit: Payer: Self-pay | Admitting: Obstetrics and Gynecology

## 2017-09-24 ENCOUNTER — Other Ambulatory Visit (HOSPITAL_COMMUNITY)
Admission: AD | Admit: 2017-09-24 | Discharge: 2017-09-24 | Disposition: A | Discharge status: Home / Self Care | Payer: 59 | Source: Ambulatory Visit | Attending: Obstetrics and Gynecology | Admitting: Obstetrics and Gynecology

## 2017-09-24 DIAGNOSIS — J069 Acute upper respiratory infection, unspecified: Secondary | ICD-10-CM

## 2017-09-24 MED FILL — OSELTAMIVIR PHOSPHATE 75 MG: 75 | 5 days supply | Qty: 10 | Fill #0

## 2017-09-26 LAB — CULTURE, GROUP A STREP (THRC)

## 2017-10-04 ENCOUNTER — Telehealth (HOSPITAL_COMMUNITY): Payer: Self-pay | Admitting: Obstetrics and Gynecology

## 2017-10-09 MED FILL — VERAPAMIL ER 120 MG TABLET: 120 | 30 days supply | Qty: 30 | Fill #4

## 2017-10-09 MED FILL — SERTRALINE HCL 50 MG TABLET: 50 | 30 days supply | Qty: 30 | Fill #2

## 2017-10-10 NOTE — Telephone Encounter (Signed)
Please see note.

## 2017-11-11 MED FILL — SERTRALINE HCL 50 MG TABLET: 50 | 30 days supply | Qty: 30 | Fill #3

## 2018-05-15 NOTE — Progress Notes (Addendum)
[facility] [address] [place], [place] [address] [phone]  Date:  05/20/2018   Name:  Miranda Hall   DOB:  11/09/1980   MRN:  [id number]  PCP:  Pearline Cables, MD    Chief Complaint: Annual Exam (paperwork filled out, flu shot, no pap sees gyn)   History of Present Illness:  Miranda Hall is a 37 y.o. very pleasant female patient who presents with the following:  Here today for a CPE History of stillbirth in 2-13, migraine headache I last saw her for a sick visit in January, last CPE in 2017  Here today to establish care.  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 She has 2 children (one alive and one deceased), married for nearly 8 years. She is a surgical teach in the OR.  Her living son is nearly 16 yo- he is going into 2nd grade next year.   History of migraine- she did see neurolgoy in the past and her HA is now resolved.  She may get an occasional HSA but it is resolved with ibuprofen.   She is no longer on any medication for depression   She is also in school- she is getting an advanced degree for surgical tech.   She has her menses today.  She is seeing a fertility specialist and just did a round of provera.  She hopes to be pregnant again soon Sadly she lost a daughter at 55 weeks in 2013 She has noted some indigestion- she uses tums for this. It is much worse with lying flat, occurs most days over the last month.  She has noted this for about a month. Worse with certain foods such as spicy foods, water.   She does not have any CP or pain with exercise or exertion.  She is a non smoker, does not drink alcohol, she does exercise  Her GYN is [facility]   Pap:  Per her GYN Labs: due today- she did eat this am, but that is ok  Immun: flu shot today She thinks her tdap was given about 3 years ago   She is now working in the OR , and is in [place] The schedule is better for her family as well Flu shot  today   Her allergies have not been too bad this year Her son is in 4th grade, and was dx with ADHD- he is being treated for same and is doing well   She is taking Viibryd per the "mood treatment center" and feels like this is working well for her  Her migraines have been quiet in general Her LMP was last weekend  Her energy level is good She is active in her job and they are doing "the biggest loser" at work and she has lost 20 lbs. She is trying to eat more veggies, smaller portions, and is feeling better overall   No CP or palpitations. however she does notice that on occasion she will feel like she has a hard time catching her breath. This has occurred on occasion for years, and she does not relate it to exercise or any other activity. It always resolves and she feels fine now Last noted this last week    Patient Active Problem List   Diagnosis Date Noted  . IUFD (intrauterine fetal death) 01-21-12  . Status post repeat low transverse cesarean section 01/21/12  . Breech presentation 12/31/2011  . Abnormal  GTT (glucose tolerance test) 12/31/2011  . Depression complicating pregnancy, antepartum 12/31/2011  . H/O postpartum depression, currently pregnant 12/03/2011  . Migraine headache without aura 12/03/2011  . H/O: cesarean section 12/02/2011    Past Medical History:  Diagnosis Date  . Allergy   . Anemia    during pregnancy  . Anxiety   . Depression   . GERD (gastroesophageal reflux disease)   . H/O candidiasis   . H/O postpartum depression, currently pregnant   . H/O varicella   . Headache    Migraines  . Hx of migraines   . PONV (postoperative nausea and vomiting)     Past Surgical History:  Procedure Laterality Date  . CESAREAN SECTION  2010  . CESAREAN SECTION  01/13/2012   Procedure: CESAREAN SECTION;  Surgeon: Michael Litter, MD;  Location: [facility];  Service: Gynecology;  Laterality: N/A;  Repeat Cesarean Section  . CHROMOPERTUBATION N/A 07/26/2015    Procedure: CHROMOPERTUBATION;  Surgeon: Lavina Hamman, MD;  Location: [facility];  Service: Gynecology;  Laterality: N/A;  . LAPAROSCOPY N/A 07/26/2015   Procedure: LAPAROSCOPY OPERATIVE;  Surgeon: Lavina Hamman, MD;  Location: [facility];  Service: Gynecology;  Laterality: N/A;  REQUEST CASE FOR STACY RN, MELINDA CRNA, DEE CST  . LYSIS OF ADHESION N/A 07/26/2015   Procedure: LYSIS OF ADHESION;  Surgeon: Lavina Hamman, MD;  Location: [facility];  Service: Gynecology;  Laterality: N/A;  . WISDOM TOOTH EXTRACTION     age 59    Social History   Tobacco Use  . Smoking status: Never Smoker  . Smokeless tobacco: Never Used  Substance Use Topics  . Alcohol use: No    Alcohol/week: 0.0 standard drinks  . Drug use: No    Family History  Problem Relation Age of Onset  . Depression Sister   . Kidney disease Maternal Aunt   . Cancer Maternal Grandmother   . Cancer Maternal Grandfather   . Heart disease Paternal Grandmother   . Hypertension Paternal Grandmother   . Drug abuse Paternal Grandmother   . Stroke Paternal Grandmother   . Cancer Paternal Grandmother   . Heart disease Paternal Grandfather   . Hypertension Paternal Grandfather   . Drug abuse Paternal Grandfather   . Stroke Paternal Grandfather   . Cancer Paternal Grandfather     Allergies  Allergen Reactions  . Food Anaphylaxis    Peanut allergy  . Imitrex [Sumatriptan] Shortness Of Breath    Feels like weight on chest  . Milk-Related Compounds Anaphylaxis    Medication list has been reviewed and updated.  Current Outpatient Medications on File Prior to Visit  Medication Sig Dispense Refill  . loratadine (CLARITIN) 10 MG tablet Take 10 mg by mouth daily.     No current facility-administered medications on file prior to visit.     Review of Systems:  As per HPI- otherwise negative.   Physical Examination: Vitals:   05/20/18 0827  BP: 108/60  Pulse: 71  Resp: 16  Temp: 97.9 F (36.6 C)  SpO2: 98%   Vitals:    05/20/18 0827  Weight: 172 lb (78 kg)  Height: 5\' 2"  (1.575 m)   Body mass index is 31.46 kg/m. Ideal Body Weight: Weight in (lb) to have BMI = 25: 136.4  GEN: WDWN, NAD, Non-toxic, A & O x 3 HEENT: Atraumatic, Normocephalic. Neck supple. No masses, No LAD. Ears and Nose: No external deformity. CV: RRR, No M/G/R. No JVD. No thrill. No extra heart sounds. PULM: CTA  B, no wheezes, crackles, rhonchi. No retractions. No resp. distress. No accessory muscle use. ABD: S, NT, ND, +BS. No rebound. No HSM. EXTR: No c/c/e NEURO Normal gait.  PSYCH: Normally interactive. Conversant. Not depressed or anxious appearing.  Calm demeanor.    Assessment and Plan: Physical exam  Screening for deficiency anemia - Plan: CBC  Screening for diabetes mellitus - Plan: Comprehensive metabolic panel, Hemoglobin A1c  Screening for hyperlipidemia - Plan: Lipid panel  Shortness of breath - Plan: albuterol (PROVENTIL HFA;VENTOLIN HFA) 108 (90 Base) MCG/ACT inhaler  Need for influenza vaccination - Plan: Flu Vaccine QUAD 6+ mos PF IM (Fluarix Quad PF)  Doing well- no concerns today except she relates that on occasion she will feel like she can't get a deep breath. This has been going on for years, and always seems to resolve. Will have her try albuterol prn and she will let me know how this works for her  Otherwise await her labs and complete forms for her once in Flu shot today   Signed Abbe AmsterdamJessica Copland, MD  Received her labs, message to pt  Results for orders placed or performed in visit on 05/20/18  CBC  Result Value Ref Range   WBC 5.0 4.0 - 10.5 K/uL   RBC 4.69 3.87 - 5.11 Mil/uL   Platelets 234.0 150.0 - 400.0 K/uL   Hemoglobin 13.6 12.0 - 15.0 g/dL   HCT 88.440.8 16.636.0 - 06.346.0 %   MCV 86.9 78.0 - 100.0 fl   MCHC 33.3 30.0 - 36.0 g/dL   RDW 01.613.3 01.011.5 - 93.215.5 %  Comprehensive metabolic panel  Result Value Ref Range   Sodium 143 135 - 145 mEq/L   Potassium 4.0 3.5 - 5.1 mEq/L   Chloride 106 96 -  112 mEq/L   CO2 29 19 - 32 mEq/L   Glucose, Bld 93 70 - 99 mg/dL   BUN 8 6 - 23 mg/dL   Creatinine, Ser 3.550.93 0.40 - 1.20 mg/dL   Total Bilirubin 0.6 0.2 - 1.2 mg/dL   Alkaline Phosphatase 54 39 - 117 U/L   AST 20 0 - 37 U/L   ALT 26 0 - 35 U/L   Total Protein 6.8 6.0 - 8.3 g/dL   Albumin 4.4 3.5 - 5.2 g/dL   Calcium 9.2 8.4 - 73.210.5 mg/dL   GFR 20.2572.83 >42.70>60.00 mL/min  Hemoglobin A1c  Result Value Ref Range   Hgb A1c MFr Bld 5.6 4.6 - 6.5 %  Lipid panel  Result Value Ref Range   Cholesterol 143 0 - 200 mg/dL   Triglycerides 62.371.0 0.0 - 149.0 mg/dL   HDL 76.2843.00 >31.51>39.00 mg/dL   VLDL 76.114.2 0.0 - 60.740.0 mg/dL   LDL Cholesterol 86 0 - 99 mg/dL   Total CHOL/HDL Ratio 3    NonHDL 100.11

## 2018-05-20 ENCOUNTER — Ambulatory Visit (INDEPENDENT_AMBULATORY_CARE_PROVIDER_SITE_OTHER): Payer: Managed Care, Other (non HMO) | Admitting: Family Medicine

## 2018-05-20 ENCOUNTER — Encounter: Payer: Self-pay | Admitting: Family Medicine

## 2018-05-20 VITALS — BP 108/60 | HR 71 | Temp 97.9°F | Resp 16 | Ht 62.0 in | Wt 172.0 lb

## 2018-05-20 DIAGNOSIS — Z130 Encounter for screening for diseases of the blood and blood-forming organs and certain disorders involving the immune mechanism: Secondary | ICD-10-CM

## 2018-05-20 DIAGNOSIS — Z13220 Encounter for screening for lipoid disorders: Secondary | ICD-10-CM | POA: Diagnosis not present

## 2018-05-20 DIAGNOSIS — Z131 Encounter for screening for diabetes mellitus: Secondary | ICD-10-CM | POA: Diagnosis not present

## 2018-05-20 DIAGNOSIS — Z0000 Encounter for general adult medical examination without abnormal findings: Secondary | ICD-10-CM | POA: Diagnosis not present

## 2018-05-20 DIAGNOSIS — Z23 Encounter for immunization: Secondary | ICD-10-CM | POA: Diagnosis not present

## 2018-05-20 DIAGNOSIS — R0602 Shortness of breath: Secondary | ICD-10-CM

## 2018-05-20 LAB — LIPID PANEL
CHOL/HDL RATIO: 3
Cholesterol: 143 mg/dL (ref 0–200)
HDL: 43 mg/dL (ref >39.00)
LDL CALC: 86 mg/dL (ref 0–99)
NONHDL: 100.11
Triglycerides: 71 mg/dL (ref 0.0–149.0)
VLDL: 14.2 mg/dL (ref 0.0–40.0)

## 2018-05-20 LAB — COMPREHENSIVE METABOLIC PANEL
ALT: 26 U/L (ref 0–35)
AST: 20 U/L (ref 0–37)
Albumin: 4.4 g/dL (ref 3.5–5.2)
Alkaline Phosphatase: 54 U/L (ref 39–117)
BILIRUBIN TOTAL: 0.6 mg/dL (ref 0.2–1.2)
BUN: 8 mg/dL (ref 6–23)
CO2: 29 mEq/L (ref 19–32)
Calcium: 9.2 mg/dL (ref 8.4–10.5)
Chloride: 106 mEq/L (ref 96–112)
Creatinine, Ser: 0.93 mg/dL (ref 0.40–1.20)
GFR: 72.83 mL/min (ref >60.00)
GLUCOSE: 93 mg/dL (ref 70–99)
Potassium: 4 mEq/L (ref 3.5–5.1)
SODIUM: 143 meq/L (ref 135–145)
TOTAL PROTEIN: 6.8 g/dL (ref 6.0–8.3)

## 2018-05-20 LAB — CBC
HCT: 40.8 % (ref 36.0–46.0)
HEMOGLOBIN: 13.6 g/dL (ref 12.0–15.0)
MCHC: 33.3 g/dL (ref 30.0–36.0)
MCV: 86.9 fl (ref 78.0–100.0)
Platelets: 234 10*3/uL (ref 150.0–400.0)
RBC: 4.69 Mil/uL (ref 3.87–5.11)
RDW: 13.3 % (ref 11.5–15.5)
WBC: 5 10*3/uL (ref 4.0–10.5)

## 2018-05-20 LAB — HEMOGLOBIN A1C: Hgb A1c MFr Bld: 5.6 % (ref 4.6–6.5)

## 2018-05-20 MED ORDER — ALBUTEROL SULFATE HFA 108 (90 BASE) MCG/ACT IN AERS: 2.0000 | INHALATION_SPRAY | Freq: Four times a day (QID) | RESPIRATORY_TRACT | 0 refills | Status: DC | PRN

## 2018-05-20 NOTE — Patient Instructions (Addendum)
Good to see you today!  I will be in touch with your labs asap and will fax in your form once your labs are in [place] job with weight loss and getting healthy!    For your shortness of breath episodes, try using the albuterol inhaler.  Let me know if this is not helpful or if you have any worsening of this symptom  Health Maintenance, Female Adopting a healthy lifestyle and getting preventive care can go a long way to promote health and wellness. Talk with your health care provider about what schedule of regular examinations is right for you. This is a good chance for you to check in with your provider about disease prevention and staying healthy. In between checkups, there are plenty of things you can do on your own. Experts have done a lot of research about which lifestyle changes and preventive measures are most likely to keep you healthy. Ask your health care provider for more information. Weight and diet Eat a healthy diet  Be sure to include plenty of vegetables, fruits, low-fat dairy products, and lean protein.  Do not eat a lot of foods high in solid fats, added sugars, or salt.  Get regular exercise. This is one of the most important things you can do for your health. ? Most adults should exercise for at least 150 minutes each week. The exercise should increase your heart rate and make you sweat (moderate-intensity exercise). ? Most adults should also do strengthening exercises at least twice a week. This is in addition to the moderate-intensity exercise.  Maintain a healthy weight  Body mass index (BMI) is a measurement that can be used to identify possible weight problems. It estimates body fat based on height and weight. Your health care provider can help determine your BMI and help you achieve or maintain a healthy weight.  For females 47 years of age and older: ? A BMI below 18.5 is considered underweight. ? A BMI of 18.5 to 24.9 is normal. ? A BMI of 25 to 29.9 is considered  overweight. ? A BMI of 30 and above is considered obese.  Watch levels of cholesterol and blood lipids  You should start having your blood tested for lipids and cholesterol at 35 years of age, then have this test every 5 years.  You may need to have your cholesterol levels checked more often if: ? Your lipid or cholesterol levels are high. ? You are older than 35 years of age. ? You are at high risk for heart disease.  Cancer screening Lung Cancer  Lung cancer screening is recommended for adults 44-77 years old who are at high risk for lung cancer because of a history of smoking.  A yearly low-dose CT scan of the lungs is recommended for people who: ? Currently smoke. ? Have quit within the past 15 years. ? Have at least a 30-pack-year history of smoking. A pack year is smoking an average of one pack of cigarettes a day for 1 year.  Yearly screening should continue until it has been 15 years since you quit.  Yearly screening should stop if you develop a health problem that would prevent you from having lung cancer treatment.  Breast Cancer  Practice breast self-awareness. This means understanding how your breasts normally appear and feel.  It also means doing regular breast self-exams. Let your health care provider know about any changes, no matter how small.  If you are in your 20s or 30s, you should have  a clinical breast exam (CBE) by a health care provider every 1-3 years as part of a regular health exam.  If you are 34 or older, have a CBE every year. Also consider having a breast X-ray (mammogram) every year.  If you have a family history of breast cancer, talk to your health care provider about genetic screening.  If you are at high risk for breast cancer, talk to your health care provider about having an MRI and a mammogram every year.  Breast cancer gene (BRCA) assessment is recommended for women who have family members with BRCA-related cancers. BRCA-related cancers  include: ? Breast. ? Ovarian. ? Tubal. ? Peritoneal cancers.  Results of the assessment will determine the need for genetic counseling and BRCA1 and BRCA2 testing.  Cervical Cancer Your health care provider may recommend that you be screened regularly for cancer of the pelvic organs (ovaries, uterus, and vagina). This screening involves a pelvic examination, including checking for microscopic changes to the surface of your cervix (Pap test). You may be encouraged to have this screening done every 3 years, beginning at age 60.  For women ages 53-65, health care providers may recommend pelvic exams and Pap testing every 3 years, or they may recommend the Pap and pelvic exam, combined with testing for human papilloma virus (HPV), every 5 years. Some types of HPV increase your risk of cervical cancer. Testing for HPV may also be done on women of any age with unclear Pap test results.  Other health care providers may not recommend any screening for nonpregnant women who are considered low risk for pelvic cancer and who do not have symptoms. Ask your health care provider if a screening pelvic exam is right for you.  If you have had past treatment for cervical cancer or a condition that could lead to cancer, you need Pap tests and screening for cancer for at least 20 years after your treatment. If Pap tests have been discontinued, your risk factors (such as having a new sexual partner) need to be reassessed to determine if screening should resume. Some women have medical problems that increase the chance of getting cervical cancer. In these cases, your health care provider may recommend more frequent screening and Pap tests.  Colorectal Cancer  This type of cancer can be detected and often prevented.  Routine colorectal cancer screening usually begins at 35 years of age and continues through 35 years of age.  Your health care provider may recommend screening at an earlier age if you have risk factors  for colon cancer.  Your health care provider may also recommend using home test kits to check for hidden blood in the stool.  A small camera at the end of a tube can be used to examine your colon directly (sigmoidoscopy or colonoscopy). This is done to check for the earliest forms of colorectal cancer.  Routine screening usually begins at age 81.  Direct examination of the colon should be repeated every 5-10 years through 35 years of age. However, you may need to be screened more often if early forms of precancerous polyps or small growths are found.  Skin Cancer  Check your skin from head to toe regularly.  Tell your health care provider about any new moles or changes in moles, especially if there is a change in a mole's shape or color.  Also tell your health care provider if you have a mole that is larger than the size of a pencil eraser.  Always use  sunscreen. Apply sunscreen liberally and repeatedly throughout the day.  Protect yourself by wearing long sleeves, pants, a wide-brimmed hat, and sunglasses whenever you are outside.  Heart disease, diabetes, and high blood pressure  High blood pressure causes heart disease and increases the risk of stroke. High blood pressure is more likely to develop in: ? People who have blood pressure in the high end of the normal range (130-139/85-89 mm Hg). ? People who are overweight or obese. ? People who are African American.  If you are 70-74 years of age, have your blood pressure checked every 3-5 years. If you are 12 years of age or older, have your blood pressure checked every year. You should have your blood pressure measured twice-once when you are at a hospital or clinic, and once when you are not at a hospital or clinic. Record the average of the two measurements. To check your blood pressure when you are not at a hospital or clinic, you can use: ? An automated blood pressure machine at a pharmacy. ? A home blood pressure monitor.  If  you are between 27 years and 60 years old, ask your health care provider if you should take aspirin to prevent strokes.  Have regular diabetes screenings. This involves taking a blood sample to check your fasting blood sugar level. ? If you are at a normal weight and have a low risk for diabetes, have this test once every three years after 35 years of age. ? If you are overweight and have a high risk for diabetes, consider being tested at a younger age or more often. Preventing infection Hepatitis B  If you have a higher risk for hepatitis B, you should be screened for this virus. You are considered at high risk for hepatitis B if: ? You were born in a country where hepatitis B is common. Ask your health care provider which countries are considered high risk. ? Your parents were born in a high-risk country, and you have not been immunized against hepatitis B (hepatitis B vaccine). ? You have HIV or AIDS. ? You use needles to inject street drugs. ? You live with someone who has hepatitis B. ? You have had sex with someone who has hepatitis B. ? You get hemodialysis treatment. ? You take certain medicines for conditions, including cancer, organ transplantation, and autoimmune conditions.  Hepatitis C  Blood testing is recommended for: ? Everyone born from [birth year] through 1963. ? Anyone with known risk factors for hepatitis C.  Sexually transmitted infections (STIs)  You should be screened for sexually transmitted infections (STIs) including gonorrhea and chlamydia if: ? You are sexually active and are younger than 35 years of age. ? You are older than 35 years of age and your health care provider tells you that you are at risk for this type of infection. ? Your sexual activity has changed since you were last screened and you are at an increased risk for chlamydia or gonorrhea. Ask your health care provider if you are at risk.  If you do not have HIV, but are at risk, it may be recommended  that you take a prescription medicine daily to prevent HIV infection. This is called pre-exposure prophylaxis (PrEP). You are considered at risk if: ? You are sexually active and do not regularly use condoms or know the HIV status of your partner(s). ? You take drugs by injection. ? You are sexually active with a partner who has HIV.  Talk with your health  care provider about whether you are at high risk of being infected with HIV. If you choose to begin PrEP, you should first be tested for HIV. You should then be tested every 3 months for as long as you are taking PrEP. Pregnancy  If you are premenopausal and you may become pregnant, ask your health care provider about preconception counseling.  If you may become pregnant, take 400 to 800 micrograms (mcg) of folic acid every day.  If you want to prevent pregnancy, talk to your health care provider about birth control (contraception). Osteoporosis and menopause  Osteoporosis is a disease in which the bones lose minerals and strength with aging. This can result in serious bone fractures. Your risk for osteoporosis can be identified using a bone density scan.  If you are 86 years of age or older, or if you are at risk for osteoporosis and fractures, ask your health care provider if you should be screened.  Ask your health care provider whether you should take a calcium or vitamin D supplement to lower your risk for osteoporosis.  Menopause may have certain physical symptoms and risks.  Hormone replacement therapy may reduce some of these symptoms and risks. Talk to your health care provider about whether hormone replacement therapy is right for you. Follow these instructions at home:  Schedule regular health, dental, and eye exams.  Stay current with your immunizations.  Do not use any tobacco products including cigarettes, chewing tobacco, or electronic cigarettes.  If you are pregnant, do not drink alcohol.  If you are  breastfeeding, limit how much and how often you drink alcohol.  Limit alcohol intake to no more than 1 drink per day for nonpregnant women. One drink equals 12 ounces of beer, 5 ounces of wine, or 1 ounces of hard liquor.  Do not use street drugs.  Do not share needles.  Ask your health care provider for help if you need support or information about quitting drugs.  Tell your health care provider if you often feel depressed.  Tell your health care provider if you have ever been abused or do not feel safe at home. This information is not intended to replace advice given to you by your health care provider. Make sure you discuss any questions you have with your health care provider. Document Released: 02/24/2011 Document Revised: 01/17/2016 Document Reviewed: 05/15/2015 Elsevier Interactive Patient Education  [employer].

## 2020-04-06 ENCOUNTER — Ambulatory Visit (INDEPENDENT_AMBULATORY_CARE_PROVIDER_SITE_OTHER): Payer: Self-pay | Admitting: Family Medicine

## 2020-04-06 ENCOUNTER — Encounter (INDEPENDENT_AMBULATORY_CARE_PROVIDER_SITE_OTHER): Payer: Self-pay

## 2020-04-06 ENCOUNTER — Other Ambulatory Visit: Payer: Self-pay | Attending: Family Medicine | Admitting: Family Medicine

## 2020-04-06 ENCOUNTER — Other Ambulatory Visit: Payer: Self-pay

## 2020-04-06 VITALS — BP 119/78 | HR 85 | Temp 98.2°F | Resp 18 | Ht 62.0 in | Wt 187.6 lb

## 2020-04-06 DIAGNOSIS — Z0289 Encounter for other administrative examinations: Secondary | ICD-10-CM

## 2020-04-06 NOTE — Nursing Note (Signed)
04/06/20 1400   Depression Screen   Little interest or pleasure in doing things. 1   Feeling down, depressed, or hopeless 1   PHQ 2 Total 2

## 2020-04-09 ENCOUNTER — Other Ambulatory Visit: Payer: Self-pay

## 2020-04-09 ENCOUNTER — Ambulatory Visit (INDEPENDENT_AMBULATORY_CARE_PROVIDER_SITE_OTHER): Payer: Self-pay

## 2020-04-09 DIAGNOSIS — Z0283 Encounter for blood-alcohol and blood-drug test: Secondary | ICD-10-CM

## 2020-04-09 NOTE — Nursing Note (Signed)
Pt presented for urine drug screen. Pt in exam room 8 .  Pt's identity was confirmed with valid ID.   Pt instructed to remove all belongings from pockets and wash hands. Bathroom was prepped with bluing in toilet, water shut off to sink and toilet and all adulterants removed. Pt provided a sample of adequate volume and temperature. Pt released home without incident.      Shanice Poznanski, RT (R)

## 2020-04-11 LAB — QUANTIFERON TB GOLD PLUS, BLOOD
MITOGEN MINUS NIL RESULT: 8.14 IU/mL
NIL RESULT: 0.01 IU/mL
QUANTIFERON, QUALITATIVE: NEGATIVE
TB1 AG MINUS NIL RESULT: 0 IU/mL
TB2 AG MINUS NIL RESULT: 0 IU/mL

## 2020-05-25 NOTE — Progress Notes (Signed)
.  blood draw only

## 2020-05-25 NOTE — Progress Notes (Signed)
Nursing only visit

## 2021-07-16 DIAGNOSIS — N3001 Acute cystitis with hematuria: Secondary | ICD-10-CM | POA: Diagnosis not present

## 2021-07-16 DIAGNOSIS — R350 Frequency of micturition: Secondary | ICD-10-CM | POA: Diagnosis not present

## 2021-09-26 DIAGNOSIS — F331 Major depressive disorder, recurrent, moderate: Secondary | ICD-10-CM | POA: Diagnosis not present

## 2021-09-26 DIAGNOSIS — Z760 Encounter for issue of repeat prescription: Secondary | ICD-10-CM | POA: Diagnosis not present

## 2021-10-09 ENCOUNTER — Other Ambulatory Visit: Payer: Self-pay | Admitting: Internal Medicine

## 2021-10-09 ENCOUNTER — Encounter: Payer: Self-pay | Admitting: Internal Medicine

## 2021-10-09 ENCOUNTER — Ambulatory Visit: Payer: Medicaid Other | Admitting: Internal Medicine

## 2021-10-09 ENCOUNTER — Other Ambulatory Visit: Payer: Self-pay

## 2021-10-09 VITALS — Ht 62.0 in | Wt 167.2 lb

## 2021-10-09 DIAGNOSIS — O99340 Other mental disorders complicating pregnancy, unspecified trimester: Secondary | ICD-10-CM

## 2021-10-09 DIAGNOSIS — F32A Depression, unspecified: Secondary | ICD-10-CM

## 2021-10-09 DIAGNOSIS — G43009 Migraine without aura, not intractable, without status migrainosus: Secondary | ICD-10-CM | POA: Diagnosis not present

## 2021-10-09 DIAGNOSIS — Z13220 Encounter for screening for lipoid disorders: Secondary | ICD-10-CM

## 2021-10-09 DIAGNOSIS — R0602 Shortness of breath: Secondary | ICD-10-CM | POA: Insufficient documentation

## 2021-10-09 MED ORDER — ALBUTEROL SULFATE HFA 108 (90 BASE) MCG/ACT IN AERS: 2.0000 | INHALATION_SPRAY | Freq: Four times a day (QID) | RESPIRATORY_TRACT | 6 refills | Status: AC | PRN

## 2021-10-09 MED ORDER — FLUTICASONE FUROATE-VILANTEROL 100-25 MCG/ACT IN AEPB: 1.0000 | INHALATION_SPRAY | Freq: Every day | RESPIRATORY_TRACT | 6 refills | Status: DC

## 2021-10-09 MED ORDER — METHYLPREDNISOLONE 4 MG PO TBPK: ORAL_TABLET | ORAL | 0 refills | Status: DC

## 2021-10-09 NOTE — Assessment & Plan Note (Signed)
Stable at the present time. 

## 2021-10-09 NOTE — Assessment & Plan Note (Signed)
Patient was started on medication for asthama

## 2021-10-09 NOTE — Progress Notes (Signed)
New Patient Office Visit  Subjective:  Patient ID: Miranda Hall, female    DOB: [birth year]  Age: 41 y.o. MRN: [id number]  CC:  Chief Complaint  Patient presents with   Establish Care    Patient here today to establish care. Patient has complaints of her asthma worsening and her inhaler is not working. Also having migrains that have worsened over the past couple of months.     Asthma She complains of chest tightness, cough, difficulty breathing and frequent throat clearing. There is no hemoptysis. This is a recurrent problem. The current episode started more than 1 month ago. The problem occurs 2 to 4 times per day. The cough is productive of sputum. Associated symptoms include dyspnea on exertion, headaches, nasal congestion and sweats. Pertinent negatives include no chest pain, fever, myalgias or sneezing. Her past medical history is significant for asthma.  Patient presents for check up Past Medical History:  Diagnosis Date   Allergy    Anemia    during pregnancy   Anxiety    Depression    GERD (gastroesophageal reflux disease)    H/O candidiasis    H/O postpartum depression, currently pregnant    H/O varicella    Headache    Migraines   Hx of migraines    PONV (postoperative nausea and vomiting)      Current Outpatient Medications:    DULoxetine (CYMBALTA) 60 MG capsule, Take 1 capsule by mouth daily at 2 PM., Disp: , Rfl:    fluticasone furoate-vilanterol (BREO ELLIPTA) 100-25 MCG/ACT AEPB, Inhale 1 puff into the lungs daily., Disp: 1 each, Rfl: 6   ipratropium-albuterol (DUONEB) 0.5-2.5 (3) MG/3ML SOLN, Take 3 mLs by nebulization., Disp: , Rfl:    methylPREDNISolone (MEDROL DOSEPAK) 4 MG TBPK tablet, As directed, Disp: 21 each, Rfl: 0   albuterol (VENTOLIN HFA) 108 (90 Base) MCG/ACT inhaler, Inhale 2 puffs into the lungs every 6 (six) hours as needed for wheezing or shortness of breath., Disp: 1 each, Rfl: 6   loratadine (CLARITIN) 10 MG tablet, Take 10 mg by mouth  daily., Disp: , Rfl:    Past Surgical History:  Procedure Laterality Date   CESAREAN SECTION  2010   CESAREAN SECTION  01/13/2012   Procedure: CESAREAN SECTION;  Surgeon: Michael Litter, MD;  Location: [facility];  Service: Gynecology;  Laterality: N/A;  Repeat Cesarean Section   CHROMOPERTUBATION N/A 07/26/2015   Procedure: CHROMOPERTUBATION;  Surgeon: Lavina Hamman, MD;  Location: [facility];  Service: Gynecology;  Laterality: N/A;   LAPAROSCOPY N/A 07/26/2015   Procedure: LAPAROSCOPY OPERATIVE;  Surgeon: Lavina Hamman, MD;  Location: [facility];  Service: Gynecology;  Laterality: N/A;  REQUEST CASE FOR STACY RN, MELINDA CRNA, DEE CST   LYSIS OF ADHESION N/A 07/26/2015   Procedure: LYSIS OF ADHESION;  Surgeon: Lavina Hamman, MD;  Location: [facility];  Service: Gynecology;  Laterality: N/A;   WISDOM TOOTH EXTRACTION     age 16    Family History  Problem Relation Age of Onset   Depression Sister    Kidney disease Maternal Aunt    Cancer Maternal Grandmother    Cancer Maternal Grandfather    Heart disease Paternal Grandmother    Hypertension Paternal Grandmother    Drug abuse Paternal Grandmother    Stroke Paternal Grandmother    Cancer Paternal Grandmother    Heart disease Paternal Grandfather    Hypertension Paternal Grandfather    Drug abuse Paternal Grandfather    Stroke Paternal Grandfather  Cancer Paternal Grandfather     Social History   Socioeconomic History   Marital status: Married    Spouse name: Not on file   Number of children: Not on file   Years of education: Not on file   Highest education level: Not on file  Occupational History   Not on file  Tobacco Use   Smoking status: Never   Smokeless tobacco: Never  Substance and Sexual Activity   Alcohol use: No    Alcohol/week: 0.0 standard drinks   Drug use: No   Sexual activity: Yes    Birth control/protection: None  Other Topics Concern   Not on file  Social History Narrative   Not on file   Social  Determinants of Health   Financial Resource Strain: Not on file  Food Insecurity: Not on file  Transportation Needs: Not on file  Physical Activity: Not on file  Stress: Not on file  Social Connections: Not on file  Intimate Partner Violence: Not on file    ROS Review of Systems  Constitutional:  Negative for fever.  HENT:  Negative for sneezing.   Respiratory:  Positive for cough. Negative for hemoptysis.   Cardiovascular:  Positive for dyspnea on exertion. Negative for chest pain.  Musculoskeletal:  Negative for myalgias.  Neurological:  Positive for headaches.   Objective:   Today's Vitals: Ht 5\' 2"  (1.575 m)    Wt 167 lb 3.2 oz (75.8 kg)    BMI 30.58 kg/m   Physical Exam Constitutional:      Appearance: Normal appearance. She is normal weight.  HENT:     Head: Normocephalic.     Mouth/Throat:     Mouth: Mucous membranes are moist.  Cardiovascular:     Rate and Rhythm: Normal rate and regular rhythm.  Pulmonary:     Breath sounds: Wheezing present.  Abdominal:     Palpations: Abdomen is soft.  Musculoskeletal:        General: Normal range of motion.  Skin:    General: Skin is warm.  Psychiatric:        Behavior: Behavior normal.    Assessment & Plan:   Problem List Items Addressed This Visit       Cardiovascular and Mediastinum   Migraine headache without aura - Primary    Stable at the present time      Relevant Medications   DULoxetine (CYMBALTA) 60 MG capsule     Other   Depression complicating pregnancy, antepartum    Stable      Relevant Medications   DULoxetine (CYMBALTA) 60 MG capsule   Shortness of breath    Patient was started on medication for asthama      Relevant Medications   albuterol (VENTOLIN HFA) 108 (90 Base) MCG/ACT inhaler    Outpatient Encounter Medications as of 10/09/2021  Medication Sig   DULoxetine (CYMBALTA) 60 MG capsule Take 1 capsule by mouth daily at 2 PM.   fluticasone furoate-vilanterol (BREO ELLIPTA)  100-25 MCG/ACT AEPB Inhale 1 puff into the lungs daily.   ipratropium-albuterol (DUONEB) 0.5-2.5 (3) MG/3ML SOLN Take 3 mLs by nebulization.   methylPREDNISolone (MEDROL DOSEPAK) 4 MG TBPK tablet As directed   albuterol (VENTOLIN HFA) 108 (90 Base) MCG/ACT inhaler Inhale 2 puffs into the lungs every 6 (six) hours as needed for wheezing or shortness of breath.   loratadine (CLARITIN) 10 MG tablet Take 10 mg by mouth daily.   [DISCONTINUED] albuterol (PROVENTIL HFA;VENTOLIN HFA) 108 (90 Base) MCG/ACT inhaler Inhale  2 puffs into the lungs every 6 (six) hours as needed for wheezing or shortness of breath.   No facility-administered encounter medications on file as of 10/09/2021.    Follow-up: No follow-ups on file.   Corky Downs, MD

## 2021-10-09 NOTE — Assessment & Plan Note (Signed)
Stable

## 2021-10-09 NOTE — Addendum Note (Signed)
Addended by: Jobie Quaker on: 10/09/2021 12:13 PM   Modules accepted: Orders

## 2021-10-10 ENCOUNTER — Other Ambulatory Visit: Payer: Self-pay | Admitting: Internal Medicine

## 2021-10-10 LAB — CBC WITH DIFFERENTIAL/PLATELET
Absolute Monocytes: 347 cells/uL (ref 200–950)
Basophils Absolute: 41 cells/uL (ref 0–200)
Basophils Relative: 0.6 %
Eosinophils Absolute: 496 cells/uL (ref 15–500)
Eosinophils Relative: 7.3 %
HCT: 43.6 % (ref 35.0–45.0)
Hemoglobin: 14.3 g/dL (ref 11.7–15.5)
Lymphs Abs: 2115 cells/uL (ref 850–3900)
MCH: 28.8 pg (ref 27.0–33.0)
MCHC: 32.8 g/dL (ref 32.0–36.0)
MCV: 87.7 fL (ref 80.0–100.0)
MPV: 11.6 fL (ref 7.5–12.5)
Monocytes Relative: 5.1 %
Neutro Abs: 3801 cells/uL (ref 1500–7800)
Neutrophils Relative %: 55.9 %
Platelets: 288 10*3/uL (ref 140–400)
RBC: 4.97 10*6/uL (ref 3.80–5.10)
RDW: 13 % (ref 11.0–15.0)
Total Lymphocyte: 31.1 %
WBC: 6.8 10*3/uL (ref 3.8–10.8)

## 2021-10-10 LAB — COMPLETE METABOLIC PANEL WITH GFR
AG Ratio: 1.8 (calc) (ref 1.0–2.5)
ALT: 19 U/L (ref 6–29)
AST: 16 U/L (ref 10–30)
Albumin: 4.5 g/dL (ref 3.6–5.1)
Alkaline phosphatase (APISO): 68 U/L (ref 31–125)
BUN/Creatinine Ratio: 7 (calc) (ref 6–22)
BUN: 6 mg/dL — ABNORMAL LOW (ref 7–25)
CO2: 24 mmol/L (ref 20–32)
Calcium: 9.5 mg/dL (ref 8.6–10.2)
Chloride: 105 mmol/L (ref 98–110)
Creat: 0.83 mg/dL (ref 0.50–0.97)
Globulin: 2.5 g/dL (calc) (ref 1.9–3.7)
Glucose, Bld: 85 mg/dL (ref 65–99)
Potassium: 4.8 mmol/L (ref 3.5–5.3)
Sodium: 139 mmol/L (ref 135–146)
Total Bilirubin: 0.5 mg/dL (ref 0.2–1.2)
Total Protein: 7 g/dL (ref 6.1–8.1)
eGFR: 92 mL/min/{1.73_m2} (ref >=60)

## 2021-10-10 LAB — LIPID PANEL
Cholesterol: 183 mg/dL (ref <200)
HDL: 45 mg/dL — ABNORMAL LOW (ref >=50)
LDL Cholesterol (Calc): 119 mg/dL (calc) — ABNORMAL HIGH
Non-HDL Cholesterol (Calc): 138 mg/dL (calc) — ABNORMAL HIGH (ref <130)
Total CHOL/HDL Ratio: 4.1 (calc) (ref <5.0)
Triglycerides: 86 mg/dL (ref <150)

## 2021-10-10 LAB — TSH: TSH: 1.51 mIU/L

## 2021-10-11 ENCOUNTER — Other Ambulatory Visit: Payer: Self-pay

## 2021-10-11 MED ORDER — BUDESONIDE-FORMOTEROL FUMARATE 80-4.5 MCG/ACT IN AERO: 2.0000 | INHALATION_SPRAY | Freq: Two times a day (BID) | RESPIRATORY_TRACT | 3 refills | Status: DC

## 2021-10-24 ENCOUNTER — Other Ambulatory Visit: Payer: Self-pay

## 2021-10-24 ENCOUNTER — Encounter: Payer: Self-pay | Admitting: Nurse Practitioner

## 2021-10-24 ENCOUNTER — Ambulatory Visit (INDEPENDENT_AMBULATORY_CARE_PROVIDER_SITE_OTHER): Payer: Medicaid Other | Admitting: Nurse Practitioner

## 2021-10-24 VITALS — BP 136/85 | HR 65 | Ht 62.0 in | Wt 176.3 lb

## 2021-10-24 DIAGNOSIS — Z6830 Body mass index (BMI) 30.0-30.9, adult: Secondary | ICD-10-CM | POA: Insufficient documentation

## 2021-10-24 DIAGNOSIS — G43009 Migraine without aura, not intractable, without status migrainosus: Secondary | ICD-10-CM

## 2021-10-24 DIAGNOSIS — F32A Depression, unspecified: Secondary | ICD-10-CM | POA: Diagnosis not present

## 2021-10-24 DIAGNOSIS — O99340 Other mental disorders complicating pregnancy, unspecified trimester: Secondary | ICD-10-CM | POA: Diagnosis not present

## 2021-10-24 DIAGNOSIS — E669 Obesity, unspecified: Secondary | ICD-10-CM

## 2021-10-24 MED ORDER — MECLIZINE HCL 12.5 MG PO TABS: 12.5000 mg | ORAL_TABLET | Freq: Two times a day (BID) | ORAL | 0 refills | Status: DC | PRN

## 2021-10-24 NOTE — Assessment & Plan Note (Signed)
BMI 32.25 ?Advised pt to lose weight. ?Eat healthy, remain physically active and reducing the stress level can help with weight loss. ? ?

## 2021-10-24 NOTE — Assessment & Plan Note (Signed)
Stable at present.   

## 2021-10-24 NOTE — Progress Notes (Signed)
Established Patient Office Visit  Subjective:  Patient ID: Miranda Hall, female    DOB: [birth year]  Age: 41 y.o. MRN: [id number]  CC:  Chief Complaint  Patient presents with   Migraine    Patient is here for a follow up on her migraines. Patient states they have improved and has no complaints.     HPI  Miranda Hall presents for follow up on migraine.  HPI   Past Medical History:  Diagnosis Date   Allergy    Anemia    during pregnancy   Anxiety    Depression    GERD (gastroesophageal reflux disease)    H/O candidiasis    H/O postpartum depression, currently pregnant    H/O varicella    Headache    Migraines   Hx of migraines    PONV (postoperative nausea and vomiting)     Past Surgical History:  Procedure Laterality Date   CESAREAN SECTION  2010   CESAREAN SECTION  01/13/2012   Procedure: CESAREAN SECTION;  Surgeon: Betsy Coder, MD;  Location: [facility];  Service: Gynecology;  Laterality: N/A;  Repeat Cesarean Section   CHROMOPERTUBATION N/A 07/26/2015   Procedure: CHROMOPERTUBATION;  Surgeon: Cheri Fowler, MD;  Location: [facility];  Service: Gynecology;  Laterality: N/A;   LAPAROSCOPY N/A 07/26/2015   Procedure: LAPAROSCOPY OPERATIVE;  Surgeon: Cheri Fowler, MD;  Location: [facility];  Service: Gynecology;  Laterality: N/A;  REQUEST CASE FOR STACY RN, [place] CRNA, DEE CST   LYSIS OF ADHESION N/A 07/26/2015   Procedure: LYSIS OF ADHESION;  Surgeon: Cheri Fowler, MD;  Location: [facility];  Service: Gynecology;  Laterality: N/A;   WISDOM TOOTH EXTRACTION     age 62    Family History  Problem Relation Age of Onset   Depression Sister    Kidney disease Maternal Aunt    Cancer Maternal Grandmother    Cancer Maternal Grandfather    Heart disease Paternal Grandmother    Hypertension Paternal Grandmother    Drug abuse Paternal Grandmother    Stroke Paternal Grandmother    Cancer Paternal Grandmother    Heart disease Paternal Grandfather    Hypertension  Paternal Grandfather    Drug abuse Paternal Grandfather    Stroke Paternal Grandfather    Cancer Paternal Grandfather     Social History   Socioeconomic History   Marital status: Married    Spouse name: Not on file   Number of children: Not on file   Years of education: Not on file   Highest education level: Not on file  Occupational History   Not on file  Tobacco Use   Smoking status: Never   Smokeless tobacco: Never  Substance and Sexual Activity   Alcohol use: No    Alcohol/week: 0.0 standard drinks   Drug use: No   Sexual activity: Yes    Birth control/protection: None  Other Topics Concern   Not on file  Social History Narrative   Not on file   Social Determinants of Health   Financial Resource Strain: Not on file  Food Insecurity: Not on file  Transportation Needs: Not on file  Physical Activity: Not on file  Stress: Not on file  Social Connections: Not on file  Intimate Partner Violence: Not on file     Outpatient Medications Prior to Visit  Medication Sig Dispense Refill   albuterol (VENTOLIN HFA) 108 (90 Base) MCG/ACT inhaler Inhale 2 puffs into the lungs every 6 (six) hours as needed for  wheezing or shortness of breath. 1 each 6   BREO ELLIPTA 100-25 MCG/ACT AEPB TAKE 1 PUFF BY MOUTH EVERY DAY 60 each 6   budesonide-formoterol (SYMBICORT) 80-4.5 MCG/ACT inhaler Inhale 2 puffs into the lungs 2 (two) times daily. 1 each 3   DULoxetine (CYMBALTA) 60 MG capsule Take 1 capsule by mouth daily at 2 PM.     ipratropium-albuterol (DUONEB) 0.5-2.5 (3) MG/3ML SOLN Take 3 mLs by nebulization.     loratadine (CLARITIN) 10 MG tablet Take 10 mg by mouth daily.     methylPREDNISolone (MEDROL DOSEPAK) 4 MG TBPK tablet As directed (Patient not taking: Reported on 10/24/2021) 21 each 0   No facility-administered medications prior to visit.    Allergies  Allergen Reactions   Food Anaphylaxis    Peanut allergy   Imitrex [Sumatriptan] Shortness Of Breath    Feels like  weight on chest   Milk-Related Compounds Anaphylaxis    ROS Review of Systems  Constitutional:  Negative for activity change and appetite change.  HENT:  Negative for congestion and ear discharge.   Eyes:  Negative for discharge and redness.  Respiratory:  Negative for apnea and shortness of breath.   Cardiovascular:  Negative for chest pain and palpitations.  Gastrointestinal:  Negative for abdominal distention and blood in stool.  Genitourinary: Negative.   Musculoskeletal: Negative.   Neurological:  Negative for dizziness, light-headedness and headaches.  Psychiatric/Behavioral:  Negative for agitation, behavioral problems and confusion.      Objective:    Physical Exam Constitutional:      Appearance: Normal appearance. She is obese.  HENT:     Head: Normocephalic.     Right Ear: Tympanic membrane normal.     Left Ear: Tympanic membrane normal.     Nose: Nose normal.     Mouth/Throat:     Mouth: Mucous membranes are moist.  Eyes:     Pupils: Pupils are equal, round, and reactive to light.  Cardiovascular:     Rate and Rhythm: Normal rate and regular rhythm.     Pulses: Normal pulses.     Heart sounds: Normal heart sounds.  Pulmonary:     Effort: Pulmonary effort is normal.     Breath sounds: Normal breath sounds.  Abdominal:     General: Bowel sounds are normal.     Palpations: Abdomen is soft.  Musculoskeletal:        General: Normal range of motion.  Skin:    General: Skin is warm.     Capillary Refill: Capillary refill takes less than 2 seconds.  Neurological:     General: No focal deficit present.     Mental Status: She is alert and oriented to person, place, and time. Mental status is at baseline.  Psychiatric:        Mood and Affect: Mood normal.        Behavior: Behavior normal.        Thought Content: Thought content normal.        Judgment: Judgment normal.    BP 136/85    Pulse 65    Ht _0  (1.575 m)    Wt 176 lb 4.8 oz (80 kg)    BMI 32.25  kg/m  Wt Readings from Last 3 Encounters:  10/24/21 176 lb 4.8 oz (80 kg)  10/09/21 167 lb 3.2 oz (75.8 kg)  05/20/18 172 lb (78 kg)     Health Maintenance Due  Topic Date Due   COVID-19 Vaccine (1) Never  done   HIV Screening  Never done   Hepatitis C Screening  Never done   PAP SMEAR-Modifier  04/25/2018   INFLUENZA VACCINE  03/25/2021    There are no preventive care reminders to display for this patient.  Lab Results  Component Value Date   TSH 1.51 10/09/2021   Lab Results  Component Value Date   WBC 6.8 10/09/2021   HGB 14.3 10/09/2021   HCT 43.6 10/09/2021   MCV 87.7 10/09/2021   PLT 288 10/09/2021   Lab Results  Component Value Date   NA 139 10/09/2021   K 4.8 10/09/2021   CO2 24 10/09/2021   GLUCOSE 85 10/09/2021   BUN 6 (L) 10/09/2021   CREATININE 0.83 10/09/2021   BILITOT 0.5 10/09/2021   ALKPHOS 54 05/20/2018   AST 16 10/09/2021   ALT 19 10/09/2021   PROT 7.0 10/09/2021   ALBUMIN 4.4 05/20/2018   CALCIUM 9.5 10/09/2021   EGFR 92 10/09/2021   GFR 72.83 05/20/2018   Lab Results  Component Value Date   CHOL 183 10/09/2021   Lab Results  Component Value Date   HDL 45 (L) 10/09/2021   Lab Results  Component Value Date   LDLCALC 119 (H) 10/09/2021   Lab Results  Component Value Date   TRIG 86 10/09/2021   Lab Results  Component Value Date   CHOLHDL 4.1 10/09/2021   Lab Results  Component Value Date   HGBA1C 5.6 05/20/2018      Assessment & Plan:   Problem List Items Addressed This Visit       Cardiovascular and Mediastinum   Migraine headache without aura - Primary    Stable at present.        Other   Depression complicating pregnancy, antepartum    Stable at present. Pt would like to try therapy.      Class 1 obesity without serious comorbidity with body mass index (BMI) of 30.0 to 30.9 in adult    BMI 32.25 Advised pt to lose weight. Eat healthy, remain physically active and reducing the stress level can help with  weight loss.               Patient is going on a trip and has motion sickness.          Ordered meclizine 12.5 mg as needed.   Meds ordered this encounter  Medications   meclizine (ANTIVERT) 12.5 MG tablet    Sig: Take 1 tablet (12.5 mg total) by mouth 2 (two) times daily as needed for dizziness.    Dispense:  30 tablet    Refill:  0     Follow-up: Return in about 1 month (around 11/24/2021).    Theresia Lo, NP

## 2021-10-24 NOTE — Assessment & Plan Note (Signed)
Stable at present. ?Pt would like to try therapy. ?

## 2021-11-05 ENCOUNTER — Other Ambulatory Visit: Payer: Self-pay | Admitting: Nurse Practitioner

## 2021-11-28 ENCOUNTER — Ambulatory Visit: Payer: Medicaid Other | Admitting: Nurse Practitioner

## 2021-11-29 ENCOUNTER — Ambulatory Visit: Payer: Medicaid Other | Admitting: Nurse Practitioner

## 2021-12-06 ENCOUNTER — Ambulatory Visit (INDEPENDENT_AMBULATORY_CARE_PROVIDER_SITE_OTHER): Payer: Medicaid Other | Admitting: Nurse Practitioner

## 2021-12-06 ENCOUNTER — Encounter: Payer: Self-pay | Admitting: Nurse Practitioner

## 2021-12-06 VITALS — BP 138/81 | HR 64 | Ht 62.0 in | Wt 176.0 lb

## 2021-12-06 DIAGNOSIS — M2550 Pain in unspecified joint: Secondary | ICD-10-CM

## 2021-12-06 MED ORDER — IBUPROFEN 600 MG PO TABS: 600.0000 mg | ORAL_TABLET | Freq: Three times a day (TID) | ORAL | 0 refills | Status: AC | PRN

## 2021-12-06 NOTE — Progress Notes (Signed)
? ?Established Patient Office Visit ? ?Subjective:  ?Patient ID: Miranda Hall, female    DOB: Oct 26, 1980  Age: 41 y.o. MRN: [id number] ? ?CC:  ?Chief Complaint  ?Patient presents with  ? Joint Pain  ?  Patient complains of joint pain all over, patient states that at time she is unable to use her hands. The pain effects all joints.  ? ? ? ?HPI ? ?Miranda Hall presents for joint pain in all the joints. She works as a Passenger transport manager in the [employer] . She complaints that the pain makes her unable to perform the job. She has no pain in the any of the joint at present. The patient states that the pain starts all of the sudden and the location of joint varies.  ? ?HPI  ? ?Past Medical History:  ?Diagnosis Date  ? Allergy   ? Anemia   ? during pregnancy  ? Anxiety   ? Depression   ? GERD (gastroesophageal reflux disease)   ? H/O candidiasis   ? H/O postpartum depression, currently pregnant   ? H/O varicella   ? Headache   ? Migraines  ? Hx of migraines   ? PONV (postoperative nausea and vomiting)   ? ? ?Past Surgical History:  ?Procedure Laterality Date  ? CESAREAN SECTION  2010  ? CESAREAN SECTION  01/13/2012  ? Procedure: CESAREAN SECTION;  Surgeon: Betsy Coder, MD;  Location: [facility];  Service: Gynecology;  Laterality: N/A;  Repeat Cesarean Section  ? CHROMOPERTUBATION N/A 07/26/2015  ? Procedure: CHROMOPERTUBATION;  Surgeon: Cheri Fowler, MD;  Location: [facility];  Service: Gynecology;  Laterality: N/A;  ? LAPAROSCOPY N/A 07/26/2015  ? Procedure: LAPAROSCOPY OPERATIVE;  Surgeon: Cheri Fowler, MD;  Location: [facility];  Service: Gynecology;  Laterality: N/A;  REQUEST CASE FOR STACY RN, [place] CRNA, DEE CST  ? LYSIS OF ADHESION N/A 07/26/2015  ? Procedure: LYSIS OF ADHESION;  Surgeon: Cheri Fowler, MD;  Location: [facility];  Service: Gynecology;  Laterality: N/A;  ? WISDOM TOOTH EXTRACTION    ? age 47  ? ? ?Family History  ?Problem Relation Age of Onset  ? Depression Sister   ? Kidney disease Maternal Aunt   ? Cancer  Maternal Grandmother   ? Cancer Maternal Grandfather   ? Heart disease Paternal Grandmother   ? Hypertension Paternal Grandmother   ? Drug abuse Paternal Grandmother   ? Stroke Paternal Grandmother   ? Cancer Paternal Grandmother   ? Heart disease Paternal Grandfather   ? Hypertension Paternal Grandfather   ? Drug abuse Paternal Grandfather   ? Stroke Paternal Grandfather   ? Cancer Paternal Grandfather   ? ? ?Social History  ? ?Socioeconomic History  ? Marital status: Married  ?  Spouse name: Not on file  ? Number of children: Not on file  ? Years of education: Not on file  ? Highest education level: Not on file  ?Occupational History  ? Not on file  ?Tobacco Use  ? Smoking status: Former  ?  Packs/day: 1.50  ?  Types: Cigarettes  ?  Quit date: 05/25/2012  ?  Years since quitting: 9.5  ? Smokeless tobacco: Never  ?Substance and Sexual Activity  ? Alcohol use: No  ?  Alcohol/week: 0.0 standard drinks  ? Drug use: No  ? Sexual activity: Yes  ?  Birth control/protection: None  ?Other Topics Concern  ? Not on file  ?Social History Narrative  ? Not on file  ? ?Social Determinants  of Health  ? ?Financial Resource Strain: Not on file  ?Food Insecurity: Not on file  ?Transportation Needs: Not on file  ?Physical Activity: Not on file  ?Stress: Not on file  ?Social Connections: Not on file  ?Intimate Partner Violence: Not on file  ? ? ? ?Outpatient Medications Prior to Visit  ?Medication Sig Dispense Refill  ? albuterol (VENTOLIN HFA) 108 (90 Base) MCG/ACT inhaler Inhale 2 puffs into the lungs every 6 (six) hours as needed for wheezing or shortness of breath. 1 each 6  ? BREO ELLIPTA 100-25 MCG/ACT AEPB TAKE 1 PUFF BY MOUTH EVERY DAY 60 each 6  ? budesonide-formoterol (SYMBICORT) 80-4.5 MCG/ACT inhaler Inhale 2 puffs into the lungs 2 (two) times daily. 1 each 3  ? ipratropium-albuterol (DUONEB) 0.5-2.5 (3) MG/3ML SOLN Take 3 mLs by nebulization.    ? loratadine (CLARITIN) 10 MG tablet Take 10 mg by mouth daily.    ? meclizine  (ANTIVERT) 12.5 MG tablet TAKE 1 TABLET (12.5 MG TOTAL) BY MOUTH 2 (TWO) TIMES DAILY AS NEEDED FOR DIZZINESS. 30 tablet 0  ? DULoxetine (CYMBALTA) 60 MG capsule Take 1 capsule by mouth daily at 2 PM.    ? ?No facility-administered medications prior to visit.  ? ? ?Allergies  ?Allergen Reactions  ? Food Anaphylaxis  ?  Peanut allergy  ? Imitrex [Sumatriptan] Shortness Of Breath  ?  Feels like weight on chest  ? Milk-Related Compounds Anaphylaxis  ? ? ?ROS ?Review of Systems  ?Constitutional:  Negative for activity change and appetite change.  ?HENT:  Negative for congestion and facial swelling.   ?Eyes:  Negative for pain and discharge.  ?Respiratory:  Negative for apnea, cough and wheezing.   ?Cardiovascular:  Negative for chest pain and palpitations.  ?Gastrointestinal:  Negative for abdominal distention and blood in stool.  ?Musculoskeletal:  Negative for gait problem.  ?     Joint pain not at present  ?Skin:  Negative for color change, pallor and rash.  ?Neurological:  Negative for dizziness, facial asymmetry, light-headedness and headaches.  ?Psychiatric/Behavioral:  Negative for agitation, behavioral problems and confusion.   ? ?  ?Objective:  ?  ?Physical Exam ?Constitutional:   ?   Appearance: Normal appearance. She is obese.  ?HENT:  ?   Head: Normocephalic and atraumatic.  ?   Right Ear: Tympanic membrane normal.  ?   Left Ear: Tympanic membrane normal.  ?   Nose: Nose normal.  ?   Mouth/Throat:  ?   Mouth: Mucous membranes are moist.  ?   Pharynx: Oropharynx is clear.  ?Eyes:  ?   Extraocular Movements: Extraocular movements intact.  ?   Conjunctiva/sclera: Conjunctivae normal.  ?   Pupils: Pupils are equal, round, and reactive to light.  ?Cardiovascular:  ?   Rate and Rhythm: Normal rate and regular rhythm.  ?Pulmonary:  ?   Effort: Pulmonary effort is normal.  ?   Breath sounds: Normal breath sounds.  ?Abdominal:  ?   General: Bowel sounds are normal.  ?   Palpations: Abdomen is soft.  ?Musculoskeletal:      ?   General: Normal range of motion.  ?   Cervical back: Normal range of motion.  ?Skin: ?   General: Skin is warm.  ?   Capillary Refill: Capillary refill takes less than 2 seconds.  ?Neurological:  ?   General: No focal deficit present.  ?   Mental Status: She is alert and oriented to person, place, and time. Mental  status is at baseline.  ?Psychiatric:     ?   Mood and Affect: Mood normal.     ?   Behavior: Behavior normal.     ?   Thought Content: Thought content normal.     ?   Judgment: Judgment normal.  ? ? ?BP 138/81   Pulse 64   Ht '5\' 2"'  (1.575 m)   Wt 176 lb (79.8 kg)   BMI 32.19 kg/m?  ?Wt Readings from Last 3 Encounters:  ?12/06/21 176 lb (79.8 kg)  ?10/24/21 176 lb 4.8 oz (80 kg)  ?10/09/21 167 lb 3.2 oz (75.8 kg)  ? ? ? ?Health Maintenance Due  ?Topic Date Due  ? COVID-19 Vaccine (1) Never done  ? HIV Screening  Never done  ? Hepatitis C Screening  Never done  ? PAP SMEAR-Modifier  04/25/2018  ? ? ?There are no preventive care reminders to display for this patient. ? ?Lab Results  ?Component Value Date  ? TSH 1.51 10/09/2021  ? ?Lab Results  ?Component Value Date  ? WBC 6.8 10/09/2021  ? HGB 14.3 10/09/2021  ? HCT 43.6 10/09/2021  ? MCV 87.7 10/09/2021  ? PLT 288 10/09/2021  ? ?Lab Results  ?Component Value Date  ? NA 139 10/09/2021  ? K 4.8 10/09/2021  ? CO2 24 10/09/2021  ? GLUCOSE 85 10/09/2021  ? BUN 6 (L) 10/09/2021  ? CREATININE 0.83 10/09/2021  ? BILITOT 0.5 10/09/2021  ? ALKPHOS 54 05/20/2018  ? AST 16 10/09/2021  ? ALT 19 10/09/2021  ? PROT 7.0 10/09/2021  ? ALBUMIN 4.4 05/20/2018  ? CALCIUM 9.5 10/09/2021  ? EGFR 92 10/09/2021  ? GFR 72.83 05/20/2018  ? ?Lab Results  ?Component Value Date  ? CHOL 183 10/09/2021  ? ?Lab Results  ?Component Value Date  ? HDL 45 (L) 10/09/2021  ? ?Lab Results  ?Component Value Date  ? LDLCALC 119 (H) 10/09/2021  ? ?Lab Results  ?Component Value Date  ? TRIG 86 10/09/2021  ? ?Lab Results  ?Component Value Date  ? CHOLHDL 4.1 10/09/2021  ? ?Lab Results   ?Component Value Date  ? HGBA1C 5.6 05/20/2018  ? ? ?  ?Assessment & Plan:  ? ?Problem List Items Addressed This Visit   ?None ?Visit Diagnoses   ? ? Arthralgia, unspecified joint    -  Primary  ? Relevant Ord

## 2021-12-06 NOTE — Assessment & Plan Note (Signed)
Labs ordered.

## 2021-12-27 ENCOUNTER — Other Ambulatory Visit: Payer: Self-pay | Admitting: Internal Medicine

## 2022-02-03 ENCOUNTER — Other Ambulatory Visit (HOSPITAL_COMMUNITY): Payer: Self-pay

## 2022-02-03 ENCOUNTER — Other Ambulatory Visit: Payer: Self-pay | Admitting: *Deleted

## 2022-02-03 MED ORDER — DULOXETINE HCL 60 MG PO CPEP
60.0000 mg | ORAL_CAPSULE | Freq: Every day | ORAL | 7 refills | Status: AC
  Filled 2022-02-03: qty 30, 30d supply, fill #0

## 2022-02-05 ENCOUNTER — Other Ambulatory Visit (HOSPITAL_COMMUNITY): Payer: Self-pay

## 2022-02-25 ENCOUNTER — Other Ambulatory Visit: Payer: Self-pay | Admitting: Internal Medicine

## 2022-07-16 ENCOUNTER — Other Ambulatory Visit: Payer: Self-pay | Admitting: Internal Medicine
